# Patient Record
Sex: Female | Born: 1950 | Race: White | Hispanic: No | Marital: Married | Smoking: Never smoker
Health system: Southern US, Community
[De-identification: ages and names within clinical notes are randomized; demographics above are authoritative.]

## PROBLEM LIST (undated history)

## (undated) DIAGNOSIS — S42009A Fracture of unspecified part of unspecified clavicle, initial encounter for closed fracture: Secondary | ICD-10-CM

## (undated) DIAGNOSIS — M8580 Other specified disorders of bone density and structure, unspecified site: Secondary | ICD-10-CM

## (undated) DIAGNOSIS — M810 Age-related osteoporosis without current pathological fracture: Secondary | ICD-10-CM

## (undated) DIAGNOSIS — N952 Postmenopausal atrophic vaginitis: Secondary | ICD-10-CM

## (undated) DIAGNOSIS — C50919 Malignant neoplasm of unspecified site of unspecified female breast: Secondary | ICD-10-CM

## (undated) DIAGNOSIS — Z1371 Encounter for nonprocreative screening for genetic disease carrier status: Secondary | ICD-10-CM

## (undated) HISTORY — PX: TUBAL LIGATION: SHX77

## (undated) HISTORY — DX: Age-related osteoporosis without current pathological fracture: M81.0

## (undated) HISTORY — PX: HERNIA REPAIR: SHX51

## (undated) HISTORY — DX: Fracture of unspecified part of unspecified clavicle, initial encounter for closed fracture: S42.009A

## (undated) HISTORY — PX: TOTAL HIP ARTHROPLASTY: SHX124

## (undated) HISTORY — DX: Postmenopausal atrophic vaginitis: N95.2

## (undated) HISTORY — DX: Other specified disorders of bone density and structure, unspecified site: M85.80

## (undated) HISTORY — DX: Encounter for nonprocreative screening for genetic disease carrier status: Z13.71

## (undated) HISTORY — DX: Malignant neoplasm of unspecified site of unspecified female breast: C50.919

## (undated) HISTORY — PX: BREAST SURGERY: SHX581

---

## 1997-09-01 ENCOUNTER — Other Ambulatory Visit: Admission: RE | Admit: 1997-09-01 | Discharge: 1997-09-01 | Payer: Self-pay | Admitting: Obstetrics and Gynecology

## 1998-09-05 ENCOUNTER — Other Ambulatory Visit: Admission: RE | Admit: 1998-09-05 | Discharge: 1998-09-05 | Payer: Self-pay | Admitting: Obstetrics and Gynecology

## 1999-11-05 ENCOUNTER — Other Ambulatory Visit: Admission: RE | Admit: 1999-11-05 | Discharge: 1999-11-05 | Payer: Self-pay | Admitting: Obstetrics and Gynecology

## 2000-04-03 ENCOUNTER — Other Ambulatory Visit: Admission: RE | Admit: 2000-04-03 | Discharge: 2000-04-03 | Payer: Self-pay | Admitting: Obstetrics and Gynecology

## 2000-04-03 ENCOUNTER — Encounter (INDEPENDENT_AMBULATORY_CARE_PROVIDER_SITE_OTHER): Payer: Self-pay | Admitting: Specialist

## 2000-05-01 ENCOUNTER — Encounter: Payer: Self-pay | Admitting: Obstetrics and Gynecology

## 2000-05-01 ENCOUNTER — Encounter: Admission: RE | Admit: 2000-05-01 | Discharge: 2000-05-01 | Payer: Self-pay | Admitting: Obstetrics and Gynecology

## 2000-11-25 ENCOUNTER — Other Ambulatory Visit: Admission: RE | Admit: 2000-11-25 | Discharge: 2000-11-25 | Payer: Self-pay | Admitting: Obstetrics and Gynecology

## 2001-01-21 ENCOUNTER — Encounter: Payer: Self-pay | Admitting: Obstetrics and Gynecology

## 2001-01-21 ENCOUNTER — Encounter: Admission: RE | Admit: 2001-01-21 | Discharge: 2001-01-21 | Payer: Self-pay | Admitting: Obstetrics and Gynecology

## 2001-04-15 DIAGNOSIS — C50919 Malignant neoplasm of unspecified site of unspecified female breast: Secondary | ICD-10-CM

## 2001-04-15 HISTORY — DX: Malignant neoplasm of unspecified site of unspecified female breast: C50.919

## 2002-02-03 ENCOUNTER — Other Ambulatory Visit: Admission: RE | Admit: 2002-02-03 | Discharge: 2002-02-03 | Payer: Self-pay | Admitting: Obstetrics and Gynecology

## 2002-02-22 ENCOUNTER — Other Ambulatory Visit: Admission: RE | Admit: 2002-02-22 | Discharge: 2002-02-22 | Payer: Self-pay | Admitting: Radiology

## 2002-03-24 ENCOUNTER — Encounter (INDEPENDENT_AMBULATORY_CARE_PROVIDER_SITE_OTHER): Payer: Self-pay | Admitting: *Deleted

## 2002-03-24 ENCOUNTER — Ambulatory Visit (HOSPITAL_BASED_OUTPATIENT_CLINIC_OR_DEPARTMENT_OTHER): Admission: RE | Admit: 2002-03-24 | Discharge: 2002-03-24 | Payer: Self-pay | Admitting: Surgery

## 2002-04-02 ENCOUNTER — Ambulatory Visit: Admission: RE | Admit: 2002-04-02 | Discharge: 2002-04-26 | Payer: Self-pay | Admitting: Radiation Oncology

## 2002-04-12 ENCOUNTER — Encounter (INDEPENDENT_AMBULATORY_CARE_PROVIDER_SITE_OTHER): Payer: Self-pay | Admitting: *Deleted

## 2002-04-12 ENCOUNTER — Ambulatory Visit (HOSPITAL_BASED_OUTPATIENT_CLINIC_OR_DEPARTMENT_OTHER): Admission: RE | Admit: 2002-04-12 | Discharge: 2002-04-12 | Payer: Self-pay | Admitting: Surgery

## 2002-04-12 ENCOUNTER — Encounter: Payer: Self-pay | Admitting: Surgery

## 2002-04-12 ENCOUNTER — Encounter: Admission: RE | Admit: 2002-04-12 | Discharge: 2002-04-12 | Payer: Self-pay | Admitting: Surgery

## 2002-05-03 ENCOUNTER — Encounter: Payer: Self-pay | Admitting: *Deleted

## 2002-05-03 ENCOUNTER — Ambulatory Visit (HOSPITAL_COMMUNITY): Admission: RE | Admit: 2002-05-03 | Discharge: 2002-05-03 | Payer: Self-pay | Admitting: *Deleted

## 2002-05-06 ENCOUNTER — Encounter: Payer: Self-pay | Admitting: Surgery

## 2002-05-06 ENCOUNTER — Ambulatory Visit (HOSPITAL_BASED_OUTPATIENT_CLINIC_OR_DEPARTMENT_OTHER): Admission: RE | Admit: 2002-05-06 | Discharge: 2002-05-06 | Payer: Self-pay | Admitting: Surgery

## 2002-08-18 ENCOUNTER — Ambulatory Visit (HOSPITAL_BASED_OUTPATIENT_CLINIC_OR_DEPARTMENT_OTHER): Admission: RE | Admit: 2002-08-18 | Discharge: 2002-08-18 | Payer: Self-pay | Admitting: Surgery

## 2003-02-07 ENCOUNTER — Other Ambulatory Visit: Admission: RE | Admit: 2003-02-07 | Discharge: 2003-02-07 | Payer: Self-pay | Admitting: Obstetrics and Gynecology

## 2004-02-08 ENCOUNTER — Other Ambulatory Visit: Admission: RE | Admit: 2004-02-08 | Discharge: 2004-02-08 | Payer: Self-pay | Admitting: Obstetrics and Gynecology

## 2004-03-01 ENCOUNTER — Ambulatory Visit: Payer: Self-pay | Admitting: Radiation Oncology

## 2004-04-24 ENCOUNTER — Ambulatory Visit: Payer: Self-pay | Admitting: Oncology

## 2004-08-29 ENCOUNTER — Encounter: Admission: RE | Admit: 2004-08-29 | Discharge: 2004-08-29 | Payer: Self-pay | Admitting: Surgery

## 2004-11-19 ENCOUNTER — Ambulatory Visit: Payer: Self-pay | Admitting: Oncology

## 2005-02-12 ENCOUNTER — Other Ambulatory Visit: Admission: RE | Admit: 2005-02-12 | Discharge: 2005-02-12 | Payer: Self-pay | Admitting: Obstetrics and Gynecology

## 2005-08-21 ENCOUNTER — Ambulatory Visit: Payer: Self-pay | Admitting: Oncology

## 2005-11-11 ENCOUNTER — Encounter: Admission: RE | Admit: 2005-11-11 | Discharge: 2005-11-11 | Payer: Self-pay | Admitting: Oncology

## 2006-02-13 ENCOUNTER — Other Ambulatory Visit: Admission: RE | Admit: 2006-02-13 | Discharge: 2006-02-13 | Payer: Self-pay | Admitting: Obstetrics and Gynecology

## 2006-05-19 ENCOUNTER — Ambulatory Visit: Payer: Self-pay | Admitting: Oncology

## 2007-02-16 ENCOUNTER — Other Ambulatory Visit: Admission: RE | Admit: 2007-02-16 | Discharge: 2007-02-16 | Payer: Self-pay | Admitting: Obstetrics and Gynecology

## 2007-03-19 ENCOUNTER — Ambulatory Visit: Payer: Self-pay | Admitting: Oncology

## 2007-04-16 ENCOUNTER — Ambulatory Visit: Payer: Self-pay | Admitting: Radiation Oncology

## 2007-05-15 ENCOUNTER — Ambulatory Visit: Payer: Self-pay | Admitting: Oncology

## 2007-07-29 ENCOUNTER — Ambulatory Visit: Payer: Self-pay | Admitting: Unknown Physician Specialty

## 2008-02-17 ENCOUNTER — Ambulatory Visit: Payer: Self-pay | Admitting: Obstetrics and Gynecology

## 2008-02-17 ENCOUNTER — Other Ambulatory Visit: Admission: RE | Admit: 2008-02-17 | Discharge: 2008-02-17 | Payer: Self-pay | Admitting: Obstetrics and Gynecology

## 2008-02-17 ENCOUNTER — Encounter: Payer: Self-pay | Admitting: Obstetrics and Gynecology

## 2008-10-03 ENCOUNTER — Ambulatory Visit: Payer: Self-pay | Admitting: Obstetrics and Gynecology

## 2008-10-12 ENCOUNTER — Ambulatory Visit: Payer: Self-pay | Admitting: Obstetrics and Gynecology

## 2009-02-20 ENCOUNTER — Other Ambulatory Visit: Admission: RE | Admit: 2009-02-20 | Discharge: 2009-02-20 | Payer: Self-pay | Admitting: Obstetrics and Gynecology

## 2009-02-20 ENCOUNTER — Ambulatory Visit: Payer: Self-pay | Admitting: Obstetrics and Gynecology

## 2009-02-20 ENCOUNTER — Encounter: Payer: Self-pay | Admitting: Obstetrics and Gynecology

## 2010-02-26 ENCOUNTER — Other Ambulatory Visit: Admission: RE | Admit: 2010-02-26 | Discharge: 2010-02-26 | Payer: Self-pay | Admitting: Obstetrics and Gynecology

## 2010-02-26 ENCOUNTER — Ambulatory Visit: Payer: Self-pay | Admitting: Obstetrics and Gynecology

## 2010-05-07 ENCOUNTER — Encounter: Payer: Self-pay | Admitting: Oncology

## 2010-05-08 ENCOUNTER — Ambulatory Visit
Admission: RE | Admit: 2010-05-08 | Discharge: 2010-05-08 | Payer: Self-pay | Source: Home / Self Care | Attending: Obstetrics and Gynecology | Admitting: Obstetrics and Gynecology

## 2010-08-31 NOTE — Op Note (Signed)
NAME:  Tanya Fox, SCHLOTTER [facility]                          ACCOUNT NO.:  [id number]   MEDICAL RECORD NO.:  [id number]                   PATIENT TYPE:  AMB   LOCATION:  DSC                                  FACILITY:  [facility]   PHYSICIAN:  Currie Paris, M.D.           DATE OF BIRTH:  Sep 06, 1950   DATE OF PROCEDURE:  03/24/2002  DATE OF DISCHARGE:                                 OPERATIVE REPORT   CCS# [id number]   PREOPERATIVE DIAGNOSES:  1. Incarcerated superumbilical hernia.  2. 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   POSTOPERATIVE DIAGNOSES:  1. Incarcerated superumbilical hernia.  2. 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   OPERATION PERFORMED:  1. Repair of incarcerated superumbilical hernia with mesh.  2. Excision of left breast mass.   SURGEON:  Currie Paris, M.D.   ANESTHESIA:  General (LMA)   INDICATIONS FOR PROCEDURE:  The patient is a 60 year old lady with a left  breast mass that looked to be a fibroadenoma but a fine needle aspiration  had shown a few atypical cells and excision had been recommended.  In  addition, she had a small soft mass just above the umbilicus consistent with  a small either superumbilical or umbilical hernia.  After discussion of  alternatives with the patient, she elected to proceed to repair of the  hernia and excision of the left breast mass.   DESCRIPTION OF PROCEDURE:  The patient was seen in the holding area and had  no further questions.  The superumbilical hernia was circled and the left  breast identified as the operative side.  She was taken to the operating  room and prior to the administration of any sedation, the left breast mass  was identified by palpation and confirmed with the patient and marked.  Both  the left breast and umbilical area were prepped and we draped out with  towels and a single large drape at the umbilical site.  I injected a  combination of 1% Xylocaine plus 0.5% Marcaine with epinephrine mixed  equally  and around the umbilical hernia area to help with postoperative  analgesia.  Incision was made and the fatty mass identified in the  subcutaneous tissue.  As I dissected this out from the surrounding  subcutaneous tissue, I found that this was protruding through a small  superumbilical hernia.  Once I had freed up the sac, I was able to reduce  this and there appeared to be omentum protruding out.  The defect was only  about 5 mm and was actually above the umbilicus and not an umbilical hernia  presenting above the umbilicus.  The fascia below the defect appeared to be  intact and the defect itself was only about 5 mm.   I took a small mesh plug and put it in the defect and closed the defect with  two sutures of 2-0 Prolene incorporating the plug.  The incision was then  closed with some 3-0 Vicryl and 4-0 Monocryl subcuticular and at the end of  the case Steri-Strips were applied.   Attention was turned to the left breast mass which was fairly far laterally  at about the 9 o'clock position.  With the breast retracted a little bit  medially, we made an incision over the mass, dissected a little subcutaneous  tissue until I was a little closer to the mass and could palpate it readily.  A holding suture was placed through it for traction and then the mass  excised with what I thought was a small margin of normal tissue around it.  Once this was done, bleeders controlled with the cautery and the incision  closed with some 3-0 Vicryl followed by 4-0 Monocryl subcuticular and Steri-  Strips.   The patient tolerated the procedure well.  There were no operative  complications.  All counts were correct.                                                Currie Paris, M.D.    CJS/MEDQ  D:  03/24/2002  T:  03/24/2002  Job:  [id number]   cc:   Reuel Boom L. Eda Paschal, M.D.  [address]  [place]  [place] [address]  Fax: [phone]   Dale Holcomb  [address]   [place]  [place] [address]  Fax: [phone]   Jeralyn Ruths, M.D.

## 2010-08-31 NOTE — Op Note (Signed)
NAME:  Tanya Fox, SANER [facility]                          ACCOUNT NO.:  [id number]   MEDICAL RECORD NO.:  [id number]                   PATIENT TYPE:  AMB   LOCATION:  DSC                                  FACILITY:  [facility]   PHYSICIAN:  Currie Paris, M.D.           DATE OF BIRTH:  09-Dec-1950   DATE OF PROCEDURE:  05/06/2002  DATE OF DISCHARGE:                                 OPERATIVE REPORT   PREOPERATIVE DIAGNOSIS:  1. Breast cancer.  2. Inadequate venous access for chemotherapy.   POSTOPERATIVE DIAGNOSIS:  1. Breast cancer.  2. Inadequate venous access for chemotherapy.   OPERATION PERFORMED:  Port-A-Cath placement.   SURGEON:  Currie Paris, M.D.   ANESTHESIA:  MAC.   INDICATIONS FOR PROCEDURE:  The patient is a 60 year old getting ready to  have chemotherapy and needs intravenous access.   DESCRIPTION OF PROCEDURE:  The patient was seen in the holding area and the  indications, risks and complications of the surgery were discussed with her.  She has no questions and was prepared to proceed.   The patient was taken to the operating room and after satisfactory IV  sedation, the entire upper chest and lower neck were prepped and draped as a  single sterile field.  She was placed in some Trendelenburg position.  1%  Xylocaine was infiltrated in the right infraclavicular area and the  subclavian vein entered on the initial attempt and the guidewire threaded  easily through the needle.  It was positioned in the superior vena cava and  confirmed with fluoroscopy.   Additional local was infiltrated over the anterior chest wall and transverse  incision made in the pocket fashioned with the cautery.  The tunnel was made  from that site to the guidewire site and the catheter brought through.  The  guidewire tract was dilated with the dilator and peel-away sheath.  This was  done easily and the dilator and the guidewire was removed.  The catheter  threaded easily and was  placed at 20 cm and aspirated and irrigated easily.  Using fluoroscopy I could see this appeared to be in the right atrium, so I  backed it out to 17 cm where it appeared to be in the distal superior vena  cava.  It aspirated and irrigated easily.   The reservoir was flushed with dilute heparin, attached and the locking  mechanism engaged.  It aspirated and irrigated easily.  It was sutured to  the fascia with 2-0 Prolenes.  The incision was closed with some 3-0 Vicryl  and then 4-0 Monocryl.  A final check with fluoroscopy showed good  positioning.  The catheter was aspirated one more  time and then flushed with 10 cc of dilute heparin followed by 5 cc of  concentrated aqueous heparin.  The patient tolerated the procedure well.  There were no operative complications.  All counts were correct.  Currie Paris, M.D.    CJS/MEDQ  D:  05/06/2002  T:  05/06/2002  Job:  [id number]

## 2010-08-31 NOTE — Op Note (Signed)
NAME:  Tanya Fox, Tanya Fox [facility]                          ACCOUNT NO.:  [id number]   MEDICAL RECORD NO.:  [id number]                   PATIENT TYPE:  AMB   LOCATION:  DSC                                  FACILITY:  [facility]   PHYSICIAN:  Currie Paris, M.D.           DATE OF BIRTH:  11-06-1950   DATE OF PROCEDURE:  04/12/2002  DATE OF DISCHARGE:                                 OPERATIVE REPORT   OFFICE MEDICAL RECORD NO:  [id number]   PREOPERATIVE DIAGNOSES:  Carcinoma, left breast lower outer quadrant.   POSTOPERATIVE DIAGNOSES:  Carcinoma, left breast lower outer quadrant.   OPERATION:  Re-excision (partial mastectomy) left breast cancer with blue  dye injection and axillary sentinel lymph node dissection.   SURGEON:  Currie Paris, M.D.   ANESTHESIA:  General.   CLINICAL HISTORY:  This patient is a 60 year old who had a small mass  removed from the right breast, which unfortunately proves to be a small  carcinoma.  After consultation with radiation and chemotherapy physicians,  we elected to do a sentinel node dissection followed by repeat excision;  although our margin was negative.  Previously it was quite close at one  spot.   DESCRIPTION OF PROCEDURE:  The patient was seen in the holding area and had  no further questions.  She was injected previously with her radioactive  nucleotide.  She had marked the left breast as the operative side.   She was taken to the operating room and after satisfactory general  anesthesia had been obtained, I injected 4 cc of Lymphazurin blue  subareolarly.  The breast was then prepped and draped.   Using the needle probe, a hot area was identified in the axilla and somewhat  low down a little bit anterior.  I made an incision directly over that.  I  found two blue lymphatics and traced those up a little bit, and found  initially a very blue lymph node that was excised and had counts to 5500.  There are still some counts in the axilla,  and a little further dissection  revealed a second node that was 50% and had turned blue.  There was blue  lymphatic bleeding into it.  It was excised and had counts of about 2000.  There was still some counts higher up and further dissection revealed a  third node that was not blue but did have counts at around 700.  This was  likewise excised and all three sent as sentinel nodes.  There were no  palpable adenopathy remaining and no high counts remaining with background  around 40-50 or less.   The packing was placed here and attention turned to the lumpectomy site.  I  did an elliptical incision around the old scar and then took about a 2 cm  wide piece of tissue circumferentially around and down to the muscle, using  cautery.  Bleeders had to be  either cauterized or a couple were tied with 3-  0 Vicryl.  Once this had been removed and since we were just getting margins  on a tumor that already had negative margins, I did not feel we needed to do  any touch preps.  The wound was checked for hemostasis.  I used four tiny  clips to mark the periphery and a larger clip to mark the deep and  superficial margins.  The breast tissue was not reapproximated.  I did close  the subcutaneous and then the skin with 4-0 Monocryl subcuticular.  The  axillary incision was closed with 3-0 Vicryl and 4-0 Monocryl.   I have used 0.25% Marcaine prior to making the skin incisions, and  infiltrated more later in the case to help with postoperative analgesia.  Some clips were also used in the axilla on the lymphatics.   The patient tolerated the procedure well.  There were no operative  complications.  All counts were correct.   Dr. Debby Bud from pathology reported that the three sentinel nodes were  negative by touch preps.                                               Currie Paris, M.D.    CJS/MEDQ  D:  04/12/2002  T:  04/12/2002  Job:  [id number]   cc:   Reuel Boom L. Eda Paschal, M.D.  [address], [address]  [place]  [place] [address]  Fax: [phone]   Dale Temple  [address]  [place]  [place] [address]  Fax: [phone]

## 2010-08-31 NOTE — Op Note (Signed)
   NAME:  Tanya Fox, Tanya Fox [facility]                        ACCOUNT NO.:  [id number]   MEDICAL RECORD NO.:  [id number]                   PATIENT TYPE:  AMB   LOCATION:  DSC                                  FACILITY:  [facility]   PHYSICIAN:  Currie Paris, M.D.           DATE OF BIRTH:  01-21-1951   DATE OF PROCEDURE:  08/18/2002  DATE OF DISCHARGE:                                 OPERATIVE REPORT   PREOPERATIVE DIAGNOSIS:  Unneeded Port-A-Cath.   POSTOPERATIVE DIAGNOSIS:  Unneeded Port-A-Cath.   PROCEDURE:  Removal of Port-A-Cath.   SURGEON:  Currie Paris, M.D.   ANESTHESIA:  Local.   CLINICAL HISTORY:  This patient is a 60 year old who has finished her  chemotherapy and getting ready to start radiation.  She wished to have her  port removed.   DESCRIPTION OF PROCEDURE:  The patient was seen in the minor procedure room  and the port site identified.  It was prepped with a little alcohol and  injected with 1% Xylocaine with epinephrine.  I waited about 10 minutes for  that to have good effect and then prepped the area with Betadine.  The old  scar was incised and the Port-A-Cath tubing identified and backed out a  little bit from the tract.  A figure-of-eight of 3-0 Vicryl was placed  around the tract and the Port-A-Cath tubing removed and the Vicryl suture  tied down.  Using a knife I entered the capsule around the Port-A-Cath  reservoir, cut the two holding sutures, and manipulated the reservoir out of  the capsule and pouch that it was in.  There was no significant bleeding.  The incision was closed with some 3-0 Vicryl followed by Dermabond.   The patient tolerated the procedure well, and there were no complications  noted.                                                Currie Paris, M.D.    CJS/MEDQ  D:  08/18/2002  T:  08/19/2002  Job:  [id number]

## 2010-11-29 ENCOUNTER — Encounter: Payer: Self-pay | Admitting: Obstetrics and Gynecology

## 2010-11-30 ENCOUNTER — Encounter: Payer: Self-pay | Admitting: Obstetrics and Gynecology

## 2011-02-26 ENCOUNTER — Encounter: Payer: Self-pay | Admitting: Gynecology

## 2011-02-26 DIAGNOSIS — Z853 Personal history of malignant neoplasm of breast: Secondary | ICD-10-CM | POA: Insufficient documentation

## 2011-02-26 DIAGNOSIS — N952 Postmenopausal atrophic vaginitis: Secondary | ICD-10-CM | POA: Insufficient documentation

## 2011-02-26 DIAGNOSIS — M8580 Other specified disorders of bone density and structure, unspecified site: Secondary | ICD-10-CM | POA: Insufficient documentation

## 2011-03-12 ENCOUNTER — Encounter: Payer: Self-pay | Admitting: Obstetrics and Gynecology

## 2011-03-12 ENCOUNTER — Other Ambulatory Visit (HOSPITAL_COMMUNITY)
Admission: RE | Admit: 2011-03-12 | Discharge: 2011-03-12 | Disposition: A | Discharge status: Home / Self Care | Payer: PRIVATE HEALTH INSURANCE | Source: Ambulatory Visit | Attending: Obstetrics and Gynecology | Admitting: Obstetrics and Gynecology

## 2011-03-12 ENCOUNTER — Ambulatory Visit (INDEPENDENT_AMBULATORY_CARE_PROVIDER_SITE_OTHER): Payer: PRIVATE HEALTH INSURANCE | Admitting: Obstetrics and Gynecology

## 2011-03-12 VITALS — BP 114/70 | Ht 63.0 in | Wt 116.0 lb

## 2011-03-12 DIAGNOSIS — Z01419 Encounter for gynecological examination (general) (routine) without abnormal findings: Secondary | ICD-10-CM | POA: Insufficient documentation

## 2011-03-12 DIAGNOSIS — N952 Postmenopausal atrophic vaginitis: Secondary | ICD-10-CM

## 2011-03-12 DIAGNOSIS — C50919 Malignant neoplasm of unspecified site of unspecified female breast: Secondary | ICD-10-CM

## 2011-03-12 NOTE — Progress Notes (Signed)
Patient came to see me today for her annual GYN exam. She is doing well from her breast cancer. She's upto date on her mammograms. Her medical oncologist did not not want her to use vaginal estrogen for her atrophic vaginitis. She is having no vaginal bleeding. She is having no pelvic pain. She has tried vagisil for the dryness. Her bone density this year showed stable osteopenia without an elevated fracture risk. She does her lab work through her PCP.   Physical examination:  Kennon Portela present HEENT within normal limits. Neck: Thyroid not large. No masses. Supraclavicular nodes: not enlarged. Breasts: Examined in both sitting midline position. No skin changes and no masses. Abdomen: Soft no guarding rebound or masses or hernia. Pelvic: External: Within normal limits. BUS: Within normal limits. Vaginal:within normal limits. Poor estrogen effect. No evidence of cystocele rectocele or enterocele. Cervix: clean. Uterus: Normal size and shape. Adnexa: No masses. Rectovaginal exam: Confirmatory and negative. Extremities: Within normal limits.  Assessment: Breast cancer. Atrophic vaginitis.  Plan: Continue yearly mammograms. BSGI next year. Continue periodic bone densities. Patient to try Replens for dryness.

## 2011-11-27 ENCOUNTER — Other Ambulatory Visit: Payer: Self-pay | Admitting: *Deleted

## 2011-11-27 DIAGNOSIS — Z853 Personal history of malignant neoplasm of breast: Secondary | ICD-10-CM

## 2011-12-06 ENCOUNTER — Other Ambulatory Visit: Payer: Self-pay | Admitting: Obstetrics and Gynecology

## 2011-12-06 DIAGNOSIS — Z853 Personal history of malignant neoplasm of breast: Secondary | ICD-10-CM

## 2012-01-29 ENCOUNTER — Telehealth: Payer: Self-pay | Admitting: *Deleted

## 2012-01-29 NOTE — Telephone Encounter (Signed)
Patient left VM asking if she should have further testing done? New test that a friend of her's had that sees another physician in clinic had done and wants to see if she should have it done? Test name "completion of BRCA gene test". Will leave message for MD to address. Last visit in clinic was 2009-all her info is in prior computer system.

## 2012-03-02 ENCOUNTER — Ambulatory Visit: Payer: PRIVATE HEALTH INSURANCE | Admitting: Internal Medicine

## 2012-03-05 ENCOUNTER — Encounter: Payer: Self-pay | Admitting: Internal Medicine

## 2012-03-05 ENCOUNTER — Ambulatory Visit (INDEPENDENT_AMBULATORY_CARE_PROVIDER_SITE_OTHER): Payer: PRIVATE HEALTH INSURANCE | Admitting: Internal Medicine

## 2012-03-05 VITALS — BP 112/74 | HR 62 | Temp 97.8°F | Ht 64.0 in | Wt 115.0 lb

## 2012-03-05 DIAGNOSIS — R5381 Other malaise: Secondary | ICD-10-CM

## 2012-03-05 DIAGNOSIS — D72819 Decreased white blood cell count, unspecified: Secondary | ICD-10-CM | POA: Insufficient documentation

## 2012-03-05 DIAGNOSIS — C801 Malignant (primary) neoplasm, unspecified: Secondary | ICD-10-CM

## 2012-03-05 DIAGNOSIS — Z139 Encounter for screening, unspecified: Secondary | ICD-10-CM

## 2012-03-05 DIAGNOSIS — M8580 Other specified disorders of bone density and structure, unspecified site: Secondary | ICD-10-CM

## 2012-03-05 DIAGNOSIS — M899 Disorder of bone, unspecified: Secondary | ICD-10-CM

## 2012-03-05 DIAGNOSIS — R197 Diarrhea, unspecified: Secondary | ICD-10-CM

## 2012-03-05 DIAGNOSIS — R5383 Other fatigue: Secondary | ICD-10-CM

## 2012-03-05 NOTE — Patient Instructions (Addendum)
It was nice seeing you today.  I am glad you have been doing well.  I want you to try Align (probiotic) - take one per day and benefiber - daily as we discussed.  Let me know if problems.

## 2012-03-10 ENCOUNTER — Other Ambulatory Visit: Payer: Self-pay | Admitting: Internal Medicine

## 2012-03-10 ENCOUNTER — Other Ambulatory Visit (INDEPENDENT_AMBULATORY_CARE_PROVIDER_SITE_OTHER): Payer: PRIVATE HEALTH INSURANCE

## 2012-03-10 DIAGNOSIS — R5381 Other malaise: Secondary | ICD-10-CM

## 2012-03-10 DIAGNOSIS — Z139 Encounter for screening, unspecified: Secondary | ICD-10-CM

## 2012-03-10 DIAGNOSIS — D72819 Decreased white blood cell count, unspecified: Secondary | ICD-10-CM

## 2012-03-10 DIAGNOSIS — R5383 Other fatigue: Secondary | ICD-10-CM

## 2012-03-10 LAB — COMPREHENSIVE METABOLIC PANEL
AST: 26 U/L (ref 0–37)
CO2: 27 mEq/L (ref 19–32)
Calcium: 9 mg/dL (ref 8.4–10.5)
Chloride: 104 mEq/L (ref 96–112)
Creatinine, Ser: 0.7 mg/dL (ref 0.4–1.2)
GFR: 99.03 mL/min (ref >60.00)
Sodium: 137 mEq/L (ref 135–145)
Total Protein: 6.8 g/dL (ref 6.0–8.3)

## 2012-03-10 LAB — TSH: TSH: 1.68 u[IU]/mL (ref 0.35–5.50)

## 2012-03-10 LAB — LIPID PANEL
Cholesterol: 177 mg/dL (ref 0–200)
LDL Cholesterol: 96 mg/dL (ref 0–99)
VLDL: 8.4 mg/dL (ref 0.0–40.0)

## 2012-03-10 LAB — CBC WITH DIFFERENTIAL/PLATELET
Basophils Absolute: 0 10*3/uL (ref 0.0–0.1)
Lymphs Abs: 1.1 10*3/uL (ref 0.7–4.0)
MCV: 95 fl (ref 78.0–100.0)
Monocytes Absolute: 0.3 10*3/uL (ref 0.1–1.0)
Neutro Abs: 1.4 10*3/uL (ref 1.4–7.7)
RDW: 12.8 % (ref 11.5–14.6)

## 2012-03-11 NOTE — Progress Notes (Signed)
Called and gave lab results to patient. Appointment for labs scheduled.

## 2012-03-16 ENCOUNTER — Encounter: Payer: PRIVATE HEALTH INSURANCE | Admitting: Obstetrics and Gynecology

## 2012-03-16 ENCOUNTER — Ambulatory Visit (INDEPENDENT_AMBULATORY_CARE_PROVIDER_SITE_OTHER): Payer: PRIVATE HEALTH INSURANCE | Admitting: *Deleted

## 2012-03-16 DIAGNOSIS — Z23 Encounter for immunization: Secondary | ICD-10-CM

## 2012-03-18 LAB — TB SKIN TEST: Induration: NORMAL mm

## 2012-03-22 ENCOUNTER — Encounter: Payer: Self-pay | Admitting: Internal Medicine

## 2012-03-22 NOTE — Progress Notes (Signed)
  Subjective:    Patient ID: Tanya Fox, female    DOB: September 22, 1950, 61 y.o.   MRN: [id number]  HPI 61 year old female with past history of breast cancer who comes in today for a schedule follow up and her physical with me.  She states she is doing well.  No chest pain or tightness.  No sob.  Eating and drinking well.  She does report she is having loose stool every day.  Takes immodium daily.  Has been followed by Dr Mady Gemma.  He is retiring 04/14/12.  Gets her mammograms through there - [facility].  Handling stress relatively well.    Past Medical History  Diagnosis Date  . Atrophic vaginitis   . Osteopenia   . Breast cancer     Current Outpatient Prescriptions on File Prior to Visit  Medication Sig Dispense Refill  . Ascorbic Acid (VITAMIN C PO) Take by mouth.        . Calcium Carbonate-Vitamin D (CALCIUM + D PO) Take by mouth.        . Multiple Vitamin (MULTIVITAMIN) tablet Take 1 tablet by mouth daily.        Marland Kitchen VITAMIN E PO Take by mouth.          Review of Systems Patient denies any headache, lightheadedness or dizziness.  No chest pain, tightness or palpitations.  No increased shortness of breath, cough or congestion.  No nausea or vomiting.  No abdominal pain or cramping.  Does report the loose stool as outlined.  Notices every day.  Takes Immodium.  No BRBPR or melana.  No urine change.  Some fatigue, but overall doing well.      Objective:   Physical Exam Filed Vitals:   03/05/12 1332  BP: 112/74  Pulse: 62  Temp: 97.8 F (54.42 C)   61 year old female in no acute distress.   HEENT:  Nares- clear.  Oropharynx - without lesions. NECK:  Supple.  Nontender.  No audible bruit.  HEART:  Appears to be regular. LUNGS:  No crackles or wheezing audible.  Respirations even and unlabored.  RADIAL PULSE:  Equal bilaterally.    BREASTS:  Exam performed through oncology.  ABDOMEN:  Soft, nontender.  Bowel sounds present and normal.  No audible abdominal bruit.  GU:  Performed  through gyn.    EXTREMITIES:  No increased edema present.  DP pulses palpable and equal bilaterally.          Assessment & Plan:  GI.  With the loose stool as outlined.  Start [employer].  Colonoscopy 07/29/07 revealed diverticulosis and internal hemorrhoids.  Exam otherwise normal.  Recommended follow up colonoscopy five years.  Given stool change, will refer back to GI for evaluation and question of need for repeat colonoscopy.    GYN.  Pelvic and paps performed through gyn.  States she is up to date.  Follow.  FATIGUE.  Check cbc, met c and tsh.     HEALTH MAINTENANCE.  Mammograms are done through oncology.  Pelvic and pap smears through GYN.  GI as above.

## 2012-03-22 NOTE — Assessment & Plan Note (Signed)
Followed by oncology.  They are following mammograms.  States she is up to date.  Due a follow up in 12/13.

## 2012-03-22 NOTE — Assessment & Plan Note (Signed)
Continue calcium and vitamin D.  Follow.   

## 2012-03-22 NOTE — Assessment & Plan Note (Signed)
Recheck cbc to confirm stable.  

## 2012-03-23 ENCOUNTER — Ambulatory Visit (INDEPENDENT_AMBULATORY_CARE_PROVIDER_SITE_OTHER): Payer: PRIVATE HEALTH INSURANCE | Admitting: Obstetrics and Gynecology

## 2012-03-23 ENCOUNTER — Encounter: Payer: Self-pay | Admitting: Obstetrics and Gynecology

## 2012-03-23 VITALS — BP 110/74 | Ht 63.0 in | Wt 112.0 lb

## 2012-03-23 DIAGNOSIS — Z01419 Encounter for gynecological examination (general) (routine) without abnormal findings: Secondary | ICD-10-CM

## 2012-03-23 NOTE — Patient Instructions (Signed)
Check with cancer Center-genetic testing as to whether other genetic testing should be done. Get  me most recent bone density.

## 2012-03-23 NOTE — Progress Notes (Signed)
Patient came to see me today for her annual GYN exam. She is a breast cancer survivor. She was diagnosed in 2003. She was treated with lumpectomy, radiation and chemotherapy. She no longer sees the oncologist. She has yearly mammograms and has been this year. She was tested for BRCA1 and 2 and was negative. She has never had an abnormal Pap smear. Her last Pap smear was 2012. She says she has bone densities every other year but I have not received 1 since 2008. She had mild osteopenia then with her worst T score -1.2. She will get me her more recent ones. She does her lab through her PCP. She is being evaluated for recurrent leukopenia. She is having no vaginal bleeding. She is having no pelvic pain. She is doing well in terms of menopausal symptoms. A friend  told her she should have other genetic testing besides BRCA1 and 2. Her family history is negative except for early onset breast cancer.  Physical examination:Tanya Fox present. HEENT within normal limits. Neck: Thyroid not large. No masses. Supraclavicular nodes: not enlarged. Breasts: Examined in both sitting and lying  position. No skin changes and no masses. Abdomen: Soft no guarding rebound or masses or hernia. Pelvic: External: Within normal limits. BUS: Within normal limits. Vaginal:within normal limits. Good estrogen effect. No evidence of cystocele rectocele or enterocele. Cervix: clean. Uterus: Normal size and shape. Adnexa: No masses. Rectovaginal exam: Confirmatory and negative. Extremities: Within normal limits.  Assessment: #1. 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 #2. BRCA1 and 2 negative. #3. Leukopenia #4. Osteopenia  Plan: Continue yearly mammograms. Patient to give me more recent bone densities. Pap done.The new Pap smear guidelines were discussed with the patient. I told patient I did not think she needed other genetic testing but asked  Her To check with the cancer center-genetic counseling. Leukopenia followup  with PCP.

## 2012-03-24 LAB — URINALYSIS W MICROSCOPIC + REFLEX CULTURE
Bacteria, UA: NONE SEEN
Bilirubin Urine: NEGATIVE
Casts: NONE SEEN
Crystals: NONE SEEN
Glucose, UA: NEGATIVE mg/dL
Hgb urine dipstick: NEGATIVE
Ketones, ur: NEGATIVE mg/dL
Leukocytes, UA: NEGATIVE
Nitrite: NEGATIVE
Protein, ur: NEGATIVE mg/dL
Specific Gravity, Urine: 1.011 (ref 1.005–1.030)
Squamous Epithelial / HPF: NONE SEEN
Urobilinogen, UA: 0.2 mg/dL (ref 0.0–1.0)
pH: 6.5 (ref 5.0–8.0)

## 2012-03-27 ENCOUNTER — Telehealth: Payer: Self-pay | Admitting: Obstetrics and Gynecology

## 2012-03-27 ENCOUNTER — Other Ambulatory Visit: Payer: Self-pay | Admitting: Obstetrics and Gynecology

## 2012-03-27 MED ORDER — METRONIDAZOLE 0.75 % VA GEL: 1.0000 | Freq: Two times a day (BID) | VAGINAL | Status: DC

## 2012-03-27 NOTE — Telephone Encounter (Signed)
Patient called because she picked up her Metrogel today and it says applicatorful bid.  Pt knew I had told her hs x 5 days.  I did see that it had come up that way in Epic when I put in Metrogel but I did not notice the incorrect directions.  I apologized to patient for the confusion and did confirm with her that it is appful hs x 5 nights.

## 2012-04-02 ENCOUNTER — Other Ambulatory Visit (INDEPENDENT_AMBULATORY_CARE_PROVIDER_SITE_OTHER): Payer: PRIVATE HEALTH INSURANCE

## 2012-04-02 ENCOUNTER — Encounter: Payer: Self-pay | Admitting: Obstetrics and Gynecology

## 2012-04-02 DIAGNOSIS — D72819 Decreased white blood cell count, unspecified: Secondary | ICD-10-CM

## 2012-04-02 LAB — CBC WITH DIFFERENTIAL/PLATELET
Basophils Absolute: 0 10*3/uL (ref 0.0–0.1)
HCT: 40.4 % (ref 36.0–46.0)
Lymphs Abs: 1.2 10*3/uL (ref 0.7–4.0)
Monocytes Relative: 10.9 % (ref 3.0–12.0)
Neutro Abs: 1.1 10*3/uL — ABNORMAL LOW (ref 1.4–7.7)
RDW: 12.4 % (ref 11.5–14.6)

## 2012-04-05 ENCOUNTER — Telehealth: Payer: Self-pay | Admitting: Internal Medicine

## 2012-04-05 DIAGNOSIS — D72819 Decreased white blood cell count, unspecified: Secondary | ICD-10-CM

## 2012-04-05 NOTE — Telephone Encounter (Signed)
Pt notified via my chart messaging regarding labs and need for follow up labs in one month.  i will order labs.  Please call and schedule her a nonfasting lab appt in one month.  Thanks.

## 2012-04-14 ENCOUNTER — Other Ambulatory Visit (INDEPENDENT_AMBULATORY_CARE_PROVIDER_SITE_OTHER): Payer: PRIVATE HEALTH INSURANCE

## 2012-04-14 ENCOUNTER — Telehealth: Payer: Self-pay | Admitting: Internal Medicine

## 2012-04-14 DIAGNOSIS — D72819 Decreased white blood cell count, unspecified: Secondary | ICD-10-CM

## 2012-04-14 LAB — CBC WITH DIFFERENTIAL/PLATELET
Basophils Relative: 0.9 % (ref 0.0–3.0)
Eosinophils Absolute: 0.2 10*3/uL (ref 0.0–0.7)
Hemoglobin: 13.1 g/dL (ref 12.0–15.0)
Lymphocytes Relative: 38 % (ref 12.0–46.0)
MCHC: 33.2 g/dL (ref 30.0–36.0)
MCV: 93.7 fl (ref 78.0–100.0)
Monocytes Absolute: 0.4 10*3/uL (ref 0.1–1.0)
Neutro Abs: 1.8 10*3/uL (ref 1.4–7.7)
Neutrophils Relative %: 44.8 % (ref 43.0–77.0)
Platelets: 207 10*3/uL (ref 150.0–400.0)
RBC: 4.2 Mil/uL (ref 3.87–5.11)

## 2012-04-14 NOTE — Telephone Encounter (Signed)
Pt notified of labs and need for follow up lab in 4-5 months.  I will place order for lab.  Please call and schedule her for a nonfasting lab in 4-5 months.  Thanks.

## 2012-04-17 NOTE — Telephone Encounter (Signed)
Lab appointmetn 08/24/12 pt aware of appointment

## 2012-04-28 ENCOUNTER — Ambulatory Visit: Payer: PRIVATE HEALTH INSURANCE | Admitting: Gynecology

## 2012-05-14 ENCOUNTER — Ambulatory Visit (INDEPENDENT_AMBULATORY_CARE_PROVIDER_SITE_OTHER): Payer: PRIVATE HEALTH INSURANCE | Admitting: Gynecology

## 2012-05-14 ENCOUNTER — Encounter: Payer: Self-pay | Admitting: Gynecology

## 2012-05-14 ENCOUNTER — Other Ambulatory Visit (HOSPITAL_COMMUNITY)
Admission: RE | Admit: 2012-05-14 | Discharge: 2012-05-14 | Disposition: A | Discharge status: Home / Self Care | Payer: PRIVATE HEALTH INSURANCE | Source: Ambulatory Visit | Attending: Gynecology | Admitting: Gynecology

## 2012-05-14 DIAGNOSIS — Z01419 Encounter for gynecological examination (general) (routine) without abnormal findings: Secondary | ICD-10-CM | POA: Insufficient documentation

## 2012-05-14 DIAGNOSIS — N760 Acute vaginitis: Secondary | ICD-10-CM

## 2012-05-14 DIAGNOSIS — Z1151 Encounter for screening for human papillomavirus (HPV): Secondary | ICD-10-CM | POA: Insufficient documentation

## 2012-05-14 DIAGNOSIS — R87615 Unsatisfactory cytologic smear of cervix: Secondary | ICD-10-CM

## 2012-05-14 NOTE — Patient Instructions (Signed)
Follow up in one year for annual exam 

## 2012-05-14 NOTE — Progress Notes (Signed)
Patient presents for repeat Pap smear. Recently saw Dr. Eda Paschal for her annual exam her Pap smear returned unsatisfactory due to inflammation. She was treated with MetroGel and follows up now for Pap smear.  Exam with Selena Batten assistant External BUS vagina with atrophic changes. Cervix flush with the upper vagina cervical os somewhat stenotic but admits the brush. Uterus normal size midline mobile nontender. Adnexa without masses or tenderness.  Assessment and plan: Vaginitis causing unsatisfactory Pap smear, treated repeat Pap smear now. Assuming acceptable and follow up in one year for annual exam, sooner as needed.

## 2012-05-14 NOTE — Addendum Note (Signed)
Addended by: Dayna Barker on: 05/14/2012 02:35 PM   Modules accepted: Orders

## 2012-05-30 ENCOUNTER — Other Ambulatory Visit: Payer: Self-pay

## 2012-07-30 ENCOUNTER — Ambulatory Visit: Payer: Self-pay | Admitting: Unknown Physician Specialty

## 2012-07-31 LAB — PATHOLOGY REPORT

## 2012-08-24 ENCOUNTER — Other Ambulatory Visit (INDEPENDENT_AMBULATORY_CARE_PROVIDER_SITE_OTHER): Payer: PRIVATE HEALTH INSURANCE

## 2012-08-24 DIAGNOSIS — D72819 Decreased white blood cell count, unspecified: Secondary | ICD-10-CM

## 2012-08-24 LAB — CBC WITH DIFFERENTIAL/PLATELET
Eosinophils Relative: 2.1 % (ref 0.0–5.0)
Hemoglobin: 12.8 g/dL (ref 12.0–15.0)
MCHC: 34.5 g/dL (ref 30.0–36.0)
Monocytes Absolute: 0.4 10*3/uL (ref 0.1–1.0)
Neutrophils Relative %: 56.4 % (ref 43.0–77.0)
Platelets: 195 10*3/uL (ref 150.0–400.0)

## 2012-08-25 ENCOUNTER — Encounter: Payer: Self-pay | Admitting: Internal Medicine

## 2012-11-04 ENCOUNTER — Telehealth: Payer: Self-pay | Admitting: *Deleted

## 2012-11-04 NOTE — Telephone Encounter (Signed)
Pt has appt with [facility] on 12/03/12 for bone density asking if order will be ready. I left message on her voicemail that [facility] will fax [place] order MD will sign and we will fax back

## 2012-11-10 ENCOUNTER — Telehealth: Payer: Self-pay | Admitting: *Deleted

## 2012-11-10 NOTE — Telephone Encounter (Signed)
MD response to question from 01/2012--offer referral to genetics counselor to discuss availability and indication for new testing. Left VM for patient to return call if she is still interested in his response to her question.

## 2012-11-17 ENCOUNTER — Telehealth: Payer: Self-pay | Admitting: *Deleted

## 2012-11-17 NOTE — Telephone Encounter (Signed)
Pt returned call. Left her a voicemail with Dr. Kalman Drape response. We can make a referral to genetics counselor to discuss the indication for further testing. Requested she let us know if she is interested.

## 2012-11-24 ENCOUNTER — Other Ambulatory Visit: Payer: Self-pay | Admitting: *Deleted

## 2012-11-24 DIAGNOSIS — M8580 Other specified disorders of bone density and structure, unspecified site: Secondary | ICD-10-CM

## 2012-12-03 ENCOUNTER — Encounter: Payer: Self-pay | Admitting: Gynecology

## 2012-12-08 ENCOUNTER — Other Ambulatory Visit: Payer: Self-pay | Admitting: *Deleted

## 2012-12-08 DIAGNOSIS — M8580 Other specified disorders of bone density and structure, unspecified site: Secondary | ICD-10-CM

## 2012-12-15 ENCOUNTER — Telehealth: Payer: Self-pay | Admitting: Internal Medicine

## 2012-12-15 ENCOUNTER — Encounter: Payer: Self-pay | Admitting: *Deleted

## 2012-12-15 NOTE — Telephone Encounter (Signed)
Sent pt a mychart message. 

## 2012-12-15 NOTE — Telephone Encounter (Signed)
The patient is wanting a TB skin test . She also wants to know has it been a year since her last test.

## 2012-12-16 ENCOUNTER — Encounter: Payer: Self-pay | Admitting: Gynecology

## 2013-02-18 ENCOUNTER — Other Ambulatory Visit: Payer: Self-pay

## 2013-03-08 ENCOUNTER — Ambulatory Visit (INDEPENDENT_AMBULATORY_CARE_PROVIDER_SITE_OTHER): Payer: Commercial Indemnity | Admitting: Internal Medicine

## 2013-03-08 ENCOUNTER — Encounter: Payer: Self-pay | Admitting: Internal Medicine

## 2013-03-08 ENCOUNTER — Telehealth: Payer: Self-pay | Admitting: *Deleted

## 2013-03-08 VITALS — BP 120/70 | HR 64 | Temp 98.0°F | Ht 63.25 in | Wt 117.2 lb

## 2013-03-08 DIAGNOSIS — M8580 Other specified disorders of bone density and structure, unspecified site: Secondary | ICD-10-CM

## 2013-03-08 DIAGNOSIS — C801 Malignant (primary) neoplasm, unspecified: Secondary | ICD-10-CM

## 2013-03-08 DIAGNOSIS — R197 Diarrhea, unspecified: Secondary | ICD-10-CM

## 2013-03-08 DIAGNOSIS — D72819 Decreased white blood cell count, unspecified: Secondary | ICD-10-CM

## 2013-03-08 DIAGNOSIS — M899 Disorder of bone, unspecified: Secondary | ICD-10-CM

## 2013-03-08 NOTE — Telephone Encounter (Signed)
I recommend that we talk about it when she comes in for her annual exam.

## 2013-03-08 NOTE — Telephone Encounter (Signed)
Pt has annual scheduled on 04/04/13 pt c/o vaginal dryness worsen has used Replens and a vaginal suppository with no relief. Pt is breast cancer survivor, is no longer seeing medical oncologist. Pt asked if you have any recommendations? Please advise

## 2013-03-08 NOTE — Progress Notes (Signed)
  Subjective:    Patient ID: Tanya Fox, female    DOB: 27-Jan-1951, 62 y.o.   MRN: [id number]  HPI 62 year old female with past history of breast cancer who comes in today for a schedule follow up and her physical with me.  She states she is doing well.  No chest pain or tightness.  No sob.  Eating and drinking well.  She does report she is having loose stool every day.  Takes multiple immodium daily. Saw GI.  Her a colonoscopy.  States she was instructed to take an Immodium every morning.  Has no abdominal pain or cramping.  No nausea or vomiting.  Has tried a probiotic and fiber.  Did not help.   Has been followed by Dr Mady Gemma for her breast cancer.  He is retired 04/14/12.  Gets her mammograms through [facility].  Now seeing Dr Bryna Colander.  Handling stress relatively well.    Past Medical History  Diagnosis Date  . Atrophic vaginitis   . Osteopenia 11/2012    T score -1.2 FRAX 6.6%/0.5%  . Breast cancer     Current Outpatient Prescriptions on File Prior to Visit  Medication Sig Dispense Refill  . Ascorbic Acid (VITAMIN C PO) Take by mouth.        . Calcium Carbonate-Vitamin D (CALCIUM + D PO) Take by mouth.        . Multiple Vitamin (MULTIVITAMIN) tablet Take 1 tablet by mouth daily.        Marland Kitchen VITAMIN E PO Take by mouth.         No current facility-administered medications on file prior to visit.    Review of Systems Patient denies any headache, lightheadedness or dizziness.  No sinus or allergy symptoms.  No chest pain, tightness or palpitations.  No increased shortness of breath, cough or congestion.  No nausea or vomiting.  No abdominal pain or cramping.  Does report the loose stool as outlined.  Notices every day.  Takes multiple Immodium daily.   No BRBPR or melana.  No urine change.  Some fatigue, but overall doing well.  Needs a TB skin test.       Objective:   Physical Exam  Filed Vitals:   03/08/13 1333  BP: 120/70  Pulse: 64  Temp: 98 F (64.20 C)   62 year old  female in no acute distress.   HEENT:  Nares- clear.  Oropharynx - without lesions. NECK:  Supple.  Nontender.  No audible bruit.  HEART:  Appears to be regular. LUNGS:  No crackles or wheezing audible.  Respirations even and unlabored.  RADIAL PULSE:  Equal bilaterally.    BREASTS:  Exam performed through oncology.  ABDOMEN:  Soft, nontender.  Bowel sounds present and normal.  No audible abdominal bruit.  GU:  Performed through gyn.    EXTREMITIES:  No increased edema present.  DP pulses palpable and equal bilaterally.          Assessment & Plan:  GYN.  Pelvic and paps performed through gyn.  States she is up to date.  Follow.  Due to follow up with Dr Audie Box end of December.    FATIGUE.  Check cbc, met c and tsh.     HEALTH MAINTENANCE.  Mammograms are done through oncology.  Pelvic and pap smears through GYN.  GI as outlined.

## 2013-03-08 NOTE — Telephone Encounter (Signed)
error 

## 2013-03-08 NOTE — Progress Notes (Signed)
Pre-visit discussion using our clinic review tool. No additional management support is needed unless otherwise documented below in the visit note.  

## 2013-03-08 NOTE — Telephone Encounter (Signed)
Pt is c/o vaginal dryness that has worsened. Left message for pt to call.

## 2013-03-08 NOTE — Telephone Encounter (Signed)
Left the below on pt voicemail. 

## 2013-03-09 ENCOUNTER — Telehealth: Payer: Self-pay | Admitting: *Deleted

## 2013-03-09 ENCOUNTER — Ambulatory Visit (INDEPENDENT_AMBULATORY_CARE_PROVIDER_SITE_OTHER): Payer: Commercial Indemnity | Admitting: *Deleted

## 2013-03-09 ENCOUNTER — Other Ambulatory Visit (INDEPENDENT_AMBULATORY_CARE_PROVIDER_SITE_OTHER): Payer: Commercial Indemnity

## 2013-03-09 DIAGNOSIS — C801 Malignant (primary) neoplasm, unspecified: Secondary | ICD-10-CM

## 2013-03-09 DIAGNOSIS — D72819 Decreased white blood cell count, unspecified: Secondary | ICD-10-CM

## 2013-03-09 DIAGNOSIS — M8580 Other specified disorders of bone density and structure, unspecified site: Secondary | ICD-10-CM

## 2013-03-09 DIAGNOSIS — Z13220 Encounter for screening for lipoid disorders: Secondary | ICD-10-CM

## 2013-03-09 DIAGNOSIS — Z111 Encounter for screening for respiratory tuberculosis: Secondary | ICD-10-CM

## 2013-03-09 LAB — COMPREHENSIVE METABOLIC PANEL
ALT: 17 U/L (ref 0–35)
AST: 23 U/L (ref 0–37)
Albumin: 4.1 g/dL (ref 3.5–5.2)
Alkaline Phosphatase: 77 U/L (ref 39–117)
BUN: 14 mg/dL (ref 6–23)
CO2: 28 mEq/L (ref 19–32)
Calcium: 9.4 mg/dL (ref 8.4–10.5)
Chloride: 103 mEq/L (ref 96–112)
Creatinine, Ser: 0.6 mg/dL (ref 0.4–1.2)
GFR: 108.25 mL/min (ref >60.00)
Glucose, Bld: 82 mg/dL (ref 70–99)
Potassium: 4 mEq/L (ref 3.5–5.1)
Sodium: 139 mEq/L (ref 135–145)
Total Protein: 6.5 g/dL (ref 6.0–8.3)

## 2013-03-09 LAB — CBC WITH DIFFERENTIAL/PLATELET
Basophils Absolute: 0 10*3/uL (ref 0.0–0.1)
Basophils Relative: 0.6 % (ref 0.0–3.0)
Eosinophils Absolute: 0.2 10*3/uL (ref 0.0–0.7)
HCT: 39.3 % (ref 36.0–46.0)
Lymphocytes Relative: 42.5 % (ref 12.0–46.0)
MCHC: 33.8 g/dL (ref 30.0–36.0)
MCV: 93.9 fl (ref 78.0–100.0)
Monocytes Absolute: 0.3 10*3/uL (ref 0.1–1.0)
Neutro Abs: 1.4 10*3/uL (ref 1.4–7.7)
Neutrophils Relative %: 42.9 % — ABNORMAL LOW (ref 43.0–77.0)
Platelets: 175 10*3/uL (ref 150.0–400.0)
RBC: 4.19 Mil/uL (ref 3.87–5.11)
RDW: 13.4 % (ref 11.5–14.6)
WBC: 3.3 10*3/uL — ABNORMAL LOW (ref 4.5–10.5)

## 2013-03-09 LAB — LIPID PANEL
Cholesterol: 192 mg/dL (ref 0–200)
LDL Cholesterol: 109 mg/dL — ABNORMAL HIGH (ref 0–99)
Total CHOL/HDL Ratio: 3
Triglycerides: 50 mg/dL (ref 0.0–149.0)
VLDL: 10 mg/dL (ref 0.0–40.0)

## 2013-03-09 NOTE — Telephone Encounter (Signed)
Lab orders placed.  

## 2013-03-09 NOTE — Telephone Encounter (Signed)
What labs and dx?  

## 2013-03-10 ENCOUNTER — Encounter: Payer: Self-pay | Admitting: Internal Medicine

## 2013-03-10 ENCOUNTER — Telehealth: Payer: Self-pay | Admitting: Internal Medicine

## 2013-03-10 DIAGNOSIS — D72819 Decreased white blood cell count, unspecified: Secondary | ICD-10-CM

## 2013-03-10 NOTE — Telephone Encounter (Signed)
Pt notified of lab results via my chart.  Needs a f/u non fasting lab appt in 4-6 weeks.  Please schedule and contact her with an appt date and time.  Thanks.

## 2013-03-11 ENCOUNTER — Encounter: Payer: Self-pay | Admitting: Internal Medicine

## 2013-03-11 DIAGNOSIS — R197 Diarrhea, unspecified: Secondary | ICD-10-CM | POA: Insufficient documentation

## 2013-03-11 NOTE — Assessment & Plan Note (Signed)
Followed by oncology.  They are following mammograms.  States she is up to date.  Due a follow up in 04/14/13.

## 2013-03-11 NOTE — Assessment & Plan Note (Signed)
Recheck cbc to confirm stable.  

## 2013-03-11 NOTE — Assessment & Plan Note (Signed)
Continue vitamin D.  Follow.    

## 2013-03-11 NOTE — Assessment & Plan Note (Signed)
Persistent.  Takes multiple immodium daily.  Recently saw GI.  Had a recent colonoscopy.  Will discuss with GI regarding further treatment.

## 2013-03-12 LAB — TB SKIN TEST: TB Skin Test: NEGATIVE

## 2013-03-15 NOTE — Telephone Encounter (Signed)
scheduled

## 2013-03-15 NOTE — Telephone Encounter (Signed)
Left message for pt to call office

## 2013-03-24 ENCOUNTER — Encounter: Payer: PRIVATE HEALTH INSURANCE | Admitting: Gynecology

## 2013-04-14 ENCOUNTER — Encounter: Payer: Self-pay | Admitting: Gynecology

## 2013-04-14 ENCOUNTER — Ambulatory Visit (INDEPENDENT_AMBULATORY_CARE_PROVIDER_SITE_OTHER): Payer: Managed Care, Other (non HMO) | Admitting: Gynecology

## 2013-04-14 VITALS — BP 116/74 | Ht 63.5 in | Wt 115.0 lb

## 2013-04-14 DIAGNOSIS — N952 Postmenopausal atrophic vaginitis: Secondary | ICD-10-CM

## 2013-04-14 DIAGNOSIS — Z01419 Encounter for gynecological examination (general) (routine) without abnormal findings: Secondary | ICD-10-CM

## 2013-04-14 NOTE — Progress Notes (Signed)
Tanya Fox 15-May-1950 [id number]        62 y.o.  [id number] for annual exam.  Former patient of Dr. Eda Paschal. Several issues noted below.  Past medical history,surgical history, problem list, medications, allergies, family history and social history were all reviewed and documented in the EPIC chart.  ROS:  Performed and pertinent positives and negatives are included in the history, assessment and plan .  Exam: Kim assistant Filed Vitals:   04/14/13 1426  BP: 116/74  Height: 5' 3.5" (1.613 m)  Weight: 115 lb (52.164 kg)   General appearance  Normal Skin grossly normal Head/Neck normal with no cervical or supraclavicular adenopathy thyroid normal Lungs  clear Cardiac RR, without RMG Abdominal  soft, nontender, without masses, organomegaly or hernia Breasts  examined lying and sitting without masses, retractions, discharge or axillary adenopathy. Pelvic  Ext/BUS/vagina  Normal with atrophic changes  Cervix  Normal with atrophic changes, flush with the upper vagina.  Uterus  anteverted, normal size, shape and contour, midline and mobile nontender   Adnexa  Without masses or tenderness    Anus and perineum  Normal   Rectovaginal  Normal sphincter tone without palpated masses or tenderness.    Assessment/Plan:  62 y.o. [id number] female for annual exam.   1. Postmenopausal/atrophic vaginal changes. Without significant hot flashes or night sweats. Is having significant vaginal dryness with dyspareunia. Has tried OTC products without good success. Reviewed alternative options to include vaginal estrogen such as Vagifem or estrogen cream. Issues of absorption with stimulation of breast cancer reviewed. Also the issues of thrombosis such as stroke heart attack DVT. Osphena also discussed with limited breast data in breast cancer survivors. Patient is 10 years out from her treatment of receptor positive carcinoma. Recommended she followup with her oncologist for their opinion although I think  it certainly is reasonable to consider Vagifem from a quality of life standpoint I would like their input and approval. Patient's going to make an appointment to discuss with them and continue with OTC products at this point. No vaginal bleeding or other menopausal symptoms. Patient knows to call if any vaginal bleeding. 2. 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 Status post lumpectomy, radiation and chemotherapy. Doing well with exam NED. Mammogram 11/2012. Continue annual mammography. SBE monthly reviewed. 3. Osteopenia. DEXA 11/2012 T score -1.2. FRAX 6.6%/0.5%. Increase calcium vitamin D reviewed. Repeat DEXA at two-year interval. 4. Pap smear 04/2012. The Pap smear done today. No history of significant abnormal Pap smears previously. Plan repeat Pap smear at 3 year interval. 5. Colonoscopy 2014. Repeat that they're recommended interval. 6. Health maintenance. No routine blood work done as this is all done through her primary physician's office. Followup one year, sooner as needed.   Note: This document was prepared with digital dictation and possible smart phrase technology. Any transcriptional errors that result from this process are unintentional.   Dara Lords MD, 3:00 PM 04/14/2013

## 2013-04-14 NOTE — Patient Instructions (Signed)
follow up in one year for annual exam, sooner as needed. 

## 2013-04-15 LAB — URINALYSIS W MICROSCOPIC + REFLEX CULTURE
Bacteria, UA: NONE SEEN
Bilirubin Urine: NEGATIVE
Casts: NONE SEEN
Crystals: NONE SEEN
Glucose, UA: NEGATIVE mg/dL
Hgb urine dipstick: NEGATIVE
Ketones, ur: NEGATIVE mg/dL
Leukocytes, UA: NEGATIVE
Nitrite: NEGATIVE
Protein, ur: NEGATIVE mg/dL
Specific Gravity, Urine: 1.008 (ref 1.005–1.030)
Squamous Epithelial / LPF: NONE SEEN
Urobilinogen, UA: 0.2 mg/dL (ref 0.0–1.0)
pH: 7 (ref 5.0–8.0)

## 2013-04-23 ENCOUNTER — Telehealth: Payer: Self-pay | Admitting: *Deleted

## 2013-04-23 NOTE — Telephone Encounter (Signed)
Pt called stating she will need a referral to hematology/medical oncology per note on 04/14/13. I spoke with Tiffany at [facility] she is going to contact patient to have this scheduled. Pt was informed with this as well.

## 2013-04-27 ENCOUNTER — Encounter: Payer: Self-pay | Admitting: Internal Medicine

## 2013-04-27 ENCOUNTER — Other Ambulatory Visit (INDEPENDENT_AMBULATORY_CARE_PROVIDER_SITE_OTHER): Payer: Commercial Indemnity

## 2013-04-27 DIAGNOSIS — D72819 Decreased white blood cell count, unspecified: Secondary | ICD-10-CM

## 2013-04-27 LAB — CBC WITH DIFFERENTIAL/PLATELET
BASOS ABS: 0 10*3/uL (ref 0.0–0.1)
Basophils Relative: 0.5 % (ref 0.0–3.0)
EOS ABS: 0.2 10*3/uL (ref 0.0–0.7)
Eosinophils Relative: 3.9 % (ref 0.0–5.0)
HCT: 38.8 % (ref 36.0–46.0)
Hemoglobin: 13.1 g/dL (ref 12.0–15.0)
Lymphocytes Relative: 40 % (ref 12.0–46.0)
Lymphs Abs: 1.6 10*3/uL (ref 0.7–4.0)
MCHC: 33.7 g/dL (ref 30.0–36.0)
MCV: 93.8 fl (ref 78.0–100.0)
MONO ABS: 0.3 10*3/uL (ref 0.1–1.0)
Monocytes Relative: 8.3 % (ref 3.0–12.0)
NEUTROS PCT: 47.3 % (ref 43.0–77.0)
Neutro Abs: 1.9 10*3/uL (ref 1.4–7.7)
PLATELETS: 194 10*3/uL (ref 150.0–400.0)
RBC: 4.13 Mil/uL (ref 3.87–5.11)
RDW: 13.1 % (ref 11.5–14.6)
WBC: 4 10*3/uL — ABNORMAL LOW (ref 4.5–10.5)

## 2013-05-20 ENCOUNTER — Telehealth: Payer: Self-pay | Admitting: *Deleted

## 2013-05-20 NOTE — Telephone Encounter (Signed)
Per note on 04/14/13 referral faxed to [facility], they will contact patient to schedule.

## 2013-05-27 ENCOUNTER — Telehealth: Payer: Self-pay | Admitting: *Deleted

## 2013-05-27 NOTE — Telephone Encounter (Signed)
Dr. Benay Spice does not feel it is necessary for patient to come in for appointment just to discuss the vaginal dryness issues in regards to her H/O breast cancer. He will call Dr. Phineas Real and discuss with him. If patient also needs him to call her, he will. Left message on her cell # to call office and ask for Dr. Gearldine Shown nurse.

## 2013-05-28 NOTE — Telephone Encounter (Signed)
Per 05/27/13 note from [place] office   " Dr. Benay Spice does not feel it is necessary for patient to come in for appointment just to discuss the vaginal dryness issues in regards to her H/O breast cancer. He will call Dr. Phineas Real and discuss with him. If patient also needs him to call her, he will.  Left message on her cell # to call office and ask for Dr. Gearldine Shown nurse.

## 2013-06-07 ENCOUNTER — Telehealth: Payer: Self-pay | Admitting: *Deleted

## 2013-06-07 NOTE — Telephone Encounter (Signed)
See Dr.Sherill nurse note on 05/27/13 pt was informed with this as well and is calling to follow up. Please advise

## 2013-06-08 NOTE — Telephone Encounter (Signed)
I sent a note to Dr. Learta Codding and when he responds I will let patient know and if she wants to start Vagifem and he is okay with this then we can.

## 2013-06-08 NOTE — Telephone Encounter (Signed)
Pt informed with the below note. 

## 2013-06-18 MED ORDER — ESTRADIOL 10 MCG VA TABS: 1.0000 | ORAL_TABLET | VAGINAL | Status: DC

## 2013-06-18 NOTE — Telephone Encounter (Signed)
Dr. Benay Spice sent in-basket correspondence to Dr. Phineas Real.

## 2013-06-18 NOTE — Telephone Encounter (Signed)
I just received a response from Dr. Benay Spice and he stated:  "There is no good data for or against the use of estrogen in this setting. I think there is a small potential risk of promoting breast cancer, either a new cancer or stimulating recurrent disease. If she is willing to accept this risk it is ok to use the vaginal estrogen".  If she is okay then Vagifem 10 mcg twice weekly through next annual exam

## 2013-06-18 NOTE — Telephone Encounter (Signed)
Pt informed, rx sent 

## 2013-08-16 ENCOUNTER — Other Ambulatory Visit: Payer: Self-pay

## 2013-08-16 MED ORDER — ESTRADIOL 10 MCG VA TABS: 1.0000 | ORAL_TABLET | VAGINAL | Status: DC

## 2013-08-20 ENCOUNTER — Other Ambulatory Visit: Payer: Self-pay

## 2013-08-20 MED ORDER — ESTRADIOL 10 MCG VA TABS: 1.0000 | ORAL_TABLET | VAGINAL | Status: DC

## 2013-08-23 ENCOUNTER — Other Ambulatory Visit: Payer: Self-pay

## 2013-08-23 MED ORDER — ESTRADIOL 10 MCG VA TABS: 1.0000 | ORAL_TABLET | VAGINAL | Status: DC

## 2013-12-10 ENCOUNTER — Encounter: Payer: Self-pay | Admitting: Gynecology

## 2014-02-14 ENCOUNTER — Encounter: Payer: Self-pay | Admitting: Gynecology

## 2014-02-28 ENCOUNTER — Other Ambulatory Visit: Payer: Self-pay | Admitting: Gynecology

## 2014-02-28 ENCOUNTER — Telehealth: Payer: Self-pay | Admitting: Internal Medicine

## 2014-02-28 NOTE — Telephone Encounter (Signed)
The patient is wanting an appointment with Dr. Nicki Reaper . She stated her sciatic nerve on her left side is bothering her and she wants to see Dr. Nicki Reaper.

## 2014-02-28 NOTE — Telephone Encounter (Signed)
I can see her at 12:00 tomorrow.  Work in for this problem,

## 2014-02-28 NOTE — Telephone Encounter (Signed)
Pt notified and verbalized understanding.

## 2014-02-28 NOTE — Telephone Encounter (Signed)
Spoke with pt, she states the pain is on her right side not her left.  She states the pain is a 10/10.  Further states the pain starts at her right hip and radiates down her leg, it wakes her up at night as well. Please advise

## 2014-03-01 ENCOUNTER — Ambulatory Visit (INDEPENDENT_AMBULATORY_CARE_PROVIDER_SITE_OTHER): Payer: Commercial Indemnity | Admitting: Internal Medicine

## 2014-03-01 ENCOUNTER — Encounter: Payer: Self-pay | Admitting: Internal Medicine

## 2014-03-01 ENCOUNTER — Ambulatory Visit: Payer: Self-pay | Admitting: Internal Medicine

## 2014-03-01 VITALS — BP 123/75 | HR 72 | Temp 97.7°F | Ht 63.5 in | Wt 116.2 lb

## 2014-03-01 DIAGNOSIS — M545 Low back pain, unspecified: Secondary | ICD-10-CM

## 2014-03-01 MED ORDER — MELOXICAM 15 MG PO TABS: 15.0000 mg | ORAL_TABLET | Freq: Every day | ORAL | Status: DC

## 2014-03-01 NOTE — Progress Notes (Signed)
Pre visit review using our clinic review tool, if applicable. No additional management support is needed unless otherwise documented below in the visit note. 

## 2014-03-02 ENCOUNTER — Telehealth: Payer: Self-pay | Admitting: Internal Medicine

## 2014-03-02 NOTE — Telephone Encounter (Signed)
Pt notified of L-S spine xray results via my chart.  Refer to physical therapy.

## 2014-03-06 ENCOUNTER — Encounter: Payer: Self-pay | Admitting: Internal Medicine

## 2014-03-06 DIAGNOSIS — M549 Dorsalgia, unspecified: Secondary | ICD-10-CM | POA: Insufficient documentation

## 2014-03-06 NOTE — Progress Notes (Signed)
  Subjective:    Patient ID: Tanya Fox, female    DOB: 1951/01/01, 63 y.o.   MRN: [id number]  Hip Pain   63 year old female with past history of breast cancer who comes in today as a work in with concerns regarding right lower back pain.  Has been present for approximately two months.  for the last month has been more constant.  Really worsened over the last few days.  Moving is better.  If she stands straight - aggravates.  Has been using heat and ice.  Has been taking advil with some relief.   She states she is doing well otherwise.  Staying active.     Past Medical History  Diagnosis Date  . Atrophic vaginitis   . Osteopenia 11/2012    T score -1.2 FRAX 6.6%/0.5%  . BRCA negative   . Breast cancer 2003    Left side, Stage I    Current Outpatient Prescriptions on File Prior to Visit  Medication Sig Dispense Refill  . Ascorbic Acid (VITAMIN C PO) Take by mouth.      . Calcium Carbonate-Vitamin D (CALCIUM + D PO) Take by mouth.      . Multiple Vitamin (MULTIVITAMIN) tablet Take 1 tablet by mouth daily.      Marland Kitchen VAGIFEM 10 MCG TABS vaginal tablet INSERT 1 TABLET VAGINALLY TWO TIMES A WEEK 24 tablet 0  . VITAMIN E PO Take by mouth.       No current facility-administered medications on file prior to visit.    Review of Systems Low back pain as outlined.   No nausea or vomiting.  No abdominal pain or cramping.  No pain or numbness/tingling going down the leg.         Objective:   Physical Exam  Filed Vitals:   03/01/14 1211  BP: 123/75  Pulse: 72  Temp: 97.7 F (48.70 C)   63 year old female in no acute distress. NECK:  Supple.  Nontender.  No audible bruit.  HEART:  Appears to be regular. LUNGS:  No crackles or wheezing audible.  Respirations even and unlabored. ABDOMEN:  Soft, nontender.  Bowel sounds present and normal.  No audible abdominal bruit.    EXTREMITIES:  No increased edema present.   MSK:  No pain to palpation over the spine.  No significant pain with  straight leg raise.  Motor strength appears to be equal bilateral lower extremities.            Assessment & Plan:  Right-sided low back pain without sciatica Persistent pain as outlined.  Will check L-S spine xray.  Meloxicam as directed.  Tylenol if needed. Stop advil.  Further w/up pending results.    HEALTH MAINTENANCE.  Mammograms are done through oncology.  Pelvic and pap smears through GYN.  GI as outlined.

## 2014-03-14 ENCOUNTER — Ambulatory Visit (INDEPENDENT_AMBULATORY_CARE_PROVIDER_SITE_OTHER): Payer: Commercial Indemnity | Admitting: Internal Medicine

## 2014-03-14 ENCOUNTER — Encounter: Payer: Self-pay | Admitting: Internal Medicine

## 2014-03-14 VITALS — BP 120/80 | HR 75 | Temp 98.3°F | Ht 63.5 in | Wt 113.8 lb

## 2014-03-14 DIAGNOSIS — D72819 Decreased white blood cell count, unspecified: Secondary | ICD-10-CM

## 2014-03-14 DIAGNOSIS — M545 Low back pain, unspecified: Secondary | ICD-10-CM

## 2014-03-14 DIAGNOSIS — M8580 Other specified disorders of bone density and structure, unspecified site: Secondary | ICD-10-CM

## 2014-03-14 DIAGNOSIS — Z111 Encounter for screening for respiratory tuberculosis: Secondary | ICD-10-CM

## 2014-03-14 DIAGNOSIS — C801 Malignant (primary) neoplasm, unspecified: Secondary | ICD-10-CM

## 2014-03-14 NOTE — Progress Notes (Signed)
Pre visit review using our clinic review tool, if applicable. No additional management support is needed unless otherwise documented below in the visit note. 

## 2014-03-14 NOTE — Progress Notes (Signed)
Subjective:    Patient ID: Tanya Fox, female    DOB: 09-Nov-1950, 63 y.o.   MRN: [id number]  HPI 63 year old female with past history of breast cancer who comes in today for a schedule follow up and her physical with me.  She states she is doing well.  No chest pain or tightness.  No sob.  Eating and drinking well.  Has no abdominal pain or cramping.  No nausea or vomiting.  Has been followed by Dr Isaiah Blakes for her breast cancer.  He is retired 04/14/12.  Gets her mammograms through [place].  Last 12/07/13 - Birads II.  Now seeing Dr Acie Fredrickson.  Has f/u planned in 04/2014.  Handling stress well.  Back pain has resolved.  Feels good.     Past Medical History  Diagnosis Date  . Atrophic vaginitis   . Osteopenia 11/2012    T score -1.2 FRAX 6.6%/0.5%  . BRCA negative   . Breast cancer 2003    Left side, Stage I    Current Outpatient Prescriptions on File Prior to Visit  Medication Sig Dispense Refill  . Ascorbic Acid (VITAMIN C PO) Take by mouth.      . Calcium Carbonate-Vitamin D (CALCIUM + D PO) Take by mouth.      . meloxicam (MOBIC) 15 MG tablet Take 1 tablet (15 mg total) by mouth daily. 20 tablet 0  . Multiple Vitamin (MULTIVITAMIN) tablet Take 1 tablet by mouth daily.      Marland Kitchen VAGIFEM 10 MCG TABS vaginal tablet INSERT 1 TABLET VAGINALLY TWO TIMES A WEEK 24 tablet 0  . VITAMIN E PO Take by mouth.       No current facility-administered medications on file prior to visit.    Review of Systems Patient denies any headache, lightheadedness or dizziness.  No sinus or allergy symptoms.  No chest pain, tightness or palpitations.  No increased shortness of breath, cough or congestion.  No nausea or vomiting.  No abdominal pain or cramping.  Bowels stable.   No BRBPR or melana.  No urine change.  Wants a TB skin test.  Back pain is better.       Objective:   Physical Exam  Filed Vitals:   03/14/14 1330  BP: 120/80  Pulse: 75  Temp: 98.3 F (36.8 C)   Blood pressure recheck  37/48  63 year old female in no acute distress.   HEENT:  Nares- clear.  Oropharynx - without lesions. NECK:  Supple.  Nontender.  No audible bruit.  HEART:  Appears to be regular. LUNGS:  No crackles or wheezing audible.  Respirations even and unlabored.  RADIAL PULSE:  Equal bilaterally.    BREASTS:  Exam performed through oncology.  ABDOMEN:  Soft, nontender.  Bowel sounds present and normal.  No audible abdominal bruit.  GU:  Performed through gyn.    EXTREMITIES:  No increased edema present.  DP pulses palpable and equal bilaterally.          Assessment & Plan:  PPD screening test - PPD placed.   Osteopenia Continue vitamin D supplementation.  Check vitamin D level.    Cancer Up to date with mammogram.  Last 12/07/13 -birads II.  Seeing Dr Phineas Real.    Leukopenia White count has been stable.  Recheck cbc.    Right-sided low back pain without sciatica Resoved.    GYN.  Pelvic and paps performed through gyn.  States she is up to date.  Follow.  Due  to follow up with Dr Phineas Real in 04/2014.   HEALTH MAINTENANCE.  Mammograms are done through oncology.  Pelvic and pap smears through GYN.  Colonoscopy 07/30/12 - internal hemorrhoids (otherwise normal).

## 2014-03-17 LAB — TB SKIN TEST
Induration: 0 mm
TB SKIN TEST: NEGATIVE

## 2014-03-18 ENCOUNTER — Encounter: Payer: Self-pay | Admitting: Internal Medicine

## 2014-03-18 ENCOUNTER — Other Ambulatory Visit (INDEPENDENT_AMBULATORY_CARE_PROVIDER_SITE_OTHER): Payer: Commercial Indemnity

## 2014-03-18 ENCOUNTER — Telehealth: Payer: Self-pay | Admitting: *Deleted

## 2014-03-18 DIAGNOSIS — M8580 Other specified disorders of bone density and structure, unspecified site: Secondary | ICD-10-CM

## 2014-03-18 DIAGNOSIS — Z13220 Encounter for screening for lipoid disorders: Secondary | ICD-10-CM

## 2014-03-18 DIAGNOSIS — C801 Malignant (primary) neoplasm, unspecified: Secondary | ICD-10-CM

## 2014-03-18 DIAGNOSIS — D72819 Decreased white blood cell count, unspecified: Secondary | ICD-10-CM

## 2014-03-18 NOTE — Telephone Encounter (Signed)
Orders placed for labs

## 2014-03-18 NOTE — Telephone Encounter (Signed)
What labs and dx?  

## 2014-03-20 ENCOUNTER — Encounter: Payer: Self-pay | Admitting: Internal Medicine

## 2014-03-20 LAB — CBC WITH DIFFERENTIAL/PLATELET
BASOS ABS: 0 10*3/uL (ref 0.0–0.1)
Basophils Relative: 0.3 % (ref 0.0–3.0)
Eosinophils Absolute: 0.2 10*3/uL (ref 0.0–0.7)
Eosinophils Relative: 4.8 % (ref 0.0–5.0)
HEMATOCRIT: 40.2 % (ref 36.0–46.0)
Hemoglobin: 13.2 g/dL (ref 12.0–15.0)
Lymphocytes Relative: 36.8 % (ref 12.0–46.0)
Lymphs Abs: 1.3 10*3/uL (ref 0.7–4.0)
MCHC: 32.8 g/dL (ref 30.0–36.0)
MCV: 94.8 fl (ref 78.0–100.0)
MONOS PCT: 13.4 % — AB (ref 3.0–12.0)
Monocytes Absolute: 0.5 10*3/uL (ref 0.1–1.0)
Neutro Abs: 1.6 10*3/uL (ref 1.4–7.7)
Neutrophils Relative %: 44.7 % (ref 43.0–77.0)
Platelets: 201 10*3/uL (ref 150.0–400.0)
RBC: 4.24 Mil/uL (ref 3.87–5.11)
RDW: 13.1 % (ref 11.5–15.5)
WBC: 3.5 10*3/uL — ABNORMAL LOW (ref 4.0–10.5)

## 2014-03-20 LAB — COMPREHENSIVE METABOLIC PANEL
ALT: 18 U/L (ref 0–35)
AST: 26 U/L (ref 0–37)
Albumin: 4.4 g/dL (ref 3.5–5.2)
Alkaline Phosphatase: 73 U/L (ref 39–117)
BUN: 15 mg/dL (ref 6–23)
CO2: 26 meq/L (ref 19–32)
CREATININE: 0.8 mg/dL (ref 0.4–1.2)
Calcium: 9.3 mg/dL (ref 8.4–10.5)
Chloride: 106 mEq/L (ref 96–112)
GFR: 83.39 mL/min (ref >60.00)
GLUCOSE: 71 mg/dL (ref 70–99)
Potassium: 4.2 mEq/L (ref 3.5–5.1)
Sodium: 141 mEq/L (ref 135–145)
Total Bilirubin: 0.4 mg/dL (ref 0.2–1.2)
Total Protein: 6.8 g/dL (ref 6.0–8.3)

## 2014-03-20 LAB — LIPID PANEL
CHOLESTEROL: 175 mg/dL (ref 0–200)
HDL: 72.4 mg/dL (ref >39.00)
LDL Cholesterol: 91 mg/dL (ref 0–99)
NonHDL: 102.6
TRIGLYCERIDES: 57 mg/dL (ref 0.0–149.0)
Total CHOL/HDL Ratio: 2
VLDL: 11.4 mg/dL (ref 0.0–40.0)

## 2014-03-21 LAB — VITAMIN D 25 HYDROXY (VIT D DEFICIENCY, FRACTURES): VITD: 34.12 ng/mL (ref 30.00–100.00)

## 2014-03-21 LAB — TSH: TSH: 1.61 u[IU]/mL (ref 0.35–4.50)

## 2014-03-22 ENCOUNTER — Telehealth: Payer: Self-pay | Admitting: Internal Medicine

## 2014-03-22 ENCOUNTER — Encounter: Payer: Self-pay | Admitting: Internal Medicine

## 2014-03-22 DIAGNOSIS — D72819 Decreased white blood cell count, unspecified: Secondary | ICD-10-CM

## 2014-03-22 NOTE — Telephone Encounter (Signed)
Pt notified of lab results via my chart.  Needs a non fasting lab appt in 6 months.  Please schedule and contact her with an appt date and time.  Thanks.

## 2014-04-05 NOTE — Telephone Encounter (Signed)
Unread mychart message mailed to patient 

## 2014-04-20 ENCOUNTER — Encounter: Payer: Managed Care, Other (non HMO) | Admitting: Gynecology

## 2014-05-31 ENCOUNTER — Encounter: Payer: Managed Care, Other (non HMO) | Admitting: Gynecology

## 2014-06-17 ENCOUNTER — Telehealth: Payer: Self-pay | Admitting: Internal Medicine

## 2014-06-17 NOTE — Telephone Encounter (Signed)
Please call pt and confirm no increased abdominal pain, etc.  If any acute symptoms - needs evaluation.

## 2014-06-17 NOTE — Telephone Encounter (Signed)
fyi

## 2014-06-17 NOTE — Telephone Encounter (Signed)
[facility] Patient Name: Tanya Fox DOB: October 30, 1950 Initial Comment Caller states she had a flu shot in Oct and has been having flu like symptoms, Caller states she has diarrhea and feeling sick to her stomach. Caller states she does not have a fever. Nurse Assessment Guidelines Guideline Title Affirmed Question Affirmed Notes Final Disposition User Comments CALLER DID NOT WANT TO BE TRIAGED - SHE JUST WANTED TO KNOW IF SHE HAD THE FLU OF SOME KIND OF A STOMACH BUG. CALLER STATES THAT SHE STARTED DIARRHEA AND VOMITING FOR 48 HOURS. TODAY SHE IS HAVING DIARRHEA STILL. ABD PAIN HAS RESTARTED. SHE HAS TAKEN SOME TUMS. SHE FEELS LIKE SHE WANT TO VOMIT AGAIN. SHE IS NOT HAVING ANY FEVER. SHE STATES SHE WILL STOP TAKING SO MUCH OF THE IMMODIUM AND SHE WILL GO TO URGENT CARE IF SHE FEELS LIKE SHE NEEDS TO. WILL CLOSE THIS CLINICAL CALL.

## 2014-06-17 NOTE — Telephone Encounter (Signed)
Left message on VM for pt to return call.

## 2014-07-06 ENCOUNTER — Encounter: Payer: Managed Care, Other (non HMO) | Admitting: Gynecology

## 2014-07-20 ENCOUNTER — Encounter: Payer: Self-pay | Admitting: Gynecology

## 2014-07-20 ENCOUNTER — Ambulatory Visit (INDEPENDENT_AMBULATORY_CARE_PROVIDER_SITE_OTHER): Payer: Managed Care, Other (non HMO) | Admitting: Gynecology

## 2014-07-20 VITALS — BP 120/76 | Ht 63.0 in | Wt 112.0 lb

## 2014-07-20 DIAGNOSIS — N952 Postmenopausal atrophic vaginitis: Secondary | ICD-10-CM

## 2014-07-20 DIAGNOSIS — C50912 Malignant neoplasm of unspecified site of left female breast: Secondary | ICD-10-CM | POA: Diagnosis not present

## 2014-07-20 DIAGNOSIS — Z01419 Encounter for gynecological examination (general) (routine) without abnormal findings: Secondary | ICD-10-CM | POA: Diagnosis not present

## 2014-07-20 MED ORDER — ESTRADIOL 10 MCG VA TABS: ORAL_TABLET | VAGINAL | Status: DC

## 2014-07-20 NOTE — Progress Notes (Signed)
Tanya Fox [birth year] [id number]        64 y.o.  G2P2002 for annual exam.  Doing well without complaints.  Past medical history,surgical history, problem list, medications, allergies, family history and social history were all reviewed and documented as reviewed in the EPIC chart.  ROS:  Performed with pertinent positives and negatives included in the history, assessment and plan.   Additional significant findings :  none   Exam: Tanya Fox Vitals:   07/20/14 1417  BP: 120/76  Height: 5\' 3"  (1.6 m)  Weight: 112 lb (50.803 kg)   General appearance:  Normal affect, orientation and appearance. Skin: Grossly normal HEENT: Without gross lesions.  No cervical or supraclavicular adenopathy. Thyroid normal.  Lungs:  Clear without wheezing, rales or rhonchi Cardiac: RR, without RMG Abdominal:  Soft, nontender, without masses, guarding, rebound, organomegaly or hernia Breasts:  Examined lying and sitting without masses, retractions, discharge or axillary adenopathy. Pelvic:  Ext/BUS/vagina with atrophic changes  Cervix with atrophic changes  Uterus axial, normal size, shape and contour, midline and mobile nontender   Adnexa  Without masses or tenderness    Anus and perineum  Normal   Rectovaginal  Normal sphincter tone without palpated masses or tenderness.    Assessment/Plan:  64 y.o. G15P2002 female for annual exam.   1. Postmenopausal/atrophic genital changes. Started on Vagifem last year with great results. Patient wants to continue. I again reviewed the whole issue of possible absorption and systemic effects such as increased risk of thrombosis and breast cancer. She is stage I receptor positive left-sided breast cancer 2003. We discussed the use of Vagifem with Dr. Ammie Dalton and the patient's comfortable continuing this recognizing the potential for stimulating metastatic or new breast cancer. Refill 1 year provided. 2. Left-sided stage I breast cancer 2003. Exam NED.   Mammography 11/2013. Continue with annual mammography. SBE monthly reviewed. 3. Osteopenia. DEXA 2014 T score -1.2. FRAX 6.6%/0.5%. Plan on repeat DEXA in another year or 2. Increased calcium vitamin D reviewed. 4. Pap smear/HPV 2014 negative. No past Mehr done today. Plan repeat at 3-5 year interval. No history of abnormal Pap smears previously. 5. Colonoscopy 2014. Repeat at their recommended interval. 6. Health maintenance. No routine blood work done as this is done at her primary physician's office. Follow up 1 year, sooner as needed.     Tanya Auerbach MD, 2:47 PM 07/20/2014

## 2014-07-20 NOTE — Patient Instructions (Signed)
You may obtain a copy of any labs that were done today by logging onto MyChart as outlined in the instructions provided with your AVS (after visit summary). The office will not call with normal lab results but certainly if there are any significant abnormalities then we will contact you.   Health Maintenance, Female A healthy lifestyle and preventative care can promote health and wellness.  Maintain regular health, dental, and eye exams.  Eat a healthy diet. Foods like vegetables, fruits, whole grains, low-fat dairy products, and lean protein foods contain the nutrients you need without too many calories. Decrease your intake of foods high in solid fats, added sugars, and salt. Get information about a proper diet from your caregiver, if necessary.  Regular physical exercise is one of the most important things you can do for your health. Most adults should get at least 150 minutes of moderate-intensity exercise (any activity that increases your heart rate and causes you to sweat) each week. In addition, most adults need muscle-strengthening exercises on 2 or more days a week.   Maintain a healthy weight. The body mass index (BMI) is a screening tool to identify possible weight problems. It provides an estimate of body fat based on height and weight. Your caregiver can help determine your BMI, and can help you achieve or maintain a healthy weight. For adults 20 years and older:  A BMI below 18.5 is considered underweight.  A BMI of 18.5 to 24.9 is normal.  A BMI of 25 to 29.9 is considered overweight.  A BMI of 30 and above is considered obese.  Maintain normal blood lipids and cholesterol by exercising and minimizing your intake of saturated fat. Eat a balanced diet with plenty of fruits and vegetables. Blood tests for lipids and cholesterol should begin at age 61 and be repeated every 5 years. If your lipid or cholesterol levels are high, you are over 50, or you are a high risk for heart  disease, you may need your cholesterol levels checked more frequently.Ongoing high lipid and cholesterol levels should be treated with medicines if diet and exercise are not effective.  If you smoke, find out from your caregiver how to quit. If you do not use tobacco, do not start.  Lung cancer screening is recommended for adults aged 33 80 years who are at high risk for developing lung cancer because of a history of smoking. Yearly low-dose computed tomography (CT) is recommended for people who have at least a 30-pack-year history of smoking and are a current smoker or have quit within the past 15 years. A pack year of smoking is smoking an average of 1 pack of cigarettes a day for 1 year (for example: 1 pack a day for 30 years or 2 packs a day for 15 years). Yearly screening should continue until the smoker has stopped smoking for at least 15 years. Yearly screening should also be stopped for people who develop a health problem that would prevent them from having lung cancer treatment.  If you are pregnant, do not drink alcohol. If you are breastfeeding, be very cautious about drinking alcohol. If you are not pregnant and choose to drink alcohol, do not exceed 1 drink per day. One drink is considered to be 12 ounces (355 mL) of beer, 5 ounces (148 mL) of wine, or 1.5 ounces (44 mL) of liquor.  Avoid use of street drugs. Do not share needles with anyone. Ask for help if you need support or instructions about stopping  the use of drugs.  High blood pressure causes heart disease and increases the risk of stroke. Blood pressure should be checked at least every 1 to 2 years. Ongoing high blood pressure should be treated with medicines, if weight loss and exercise are not effective.  If you are 59 to 62 years old, ask your caregiver if you should take aspirin to prevent strokes.  Diabetes screening involves taking a blood sample to check your fasting blood sugar level. This should be done once every 3  years, after age 91, if you are within normal weight and without risk factors for diabetes. Testing should be considered at a younger age or be carried out more frequently if you are overweight and have at least 1 risk factor for diabetes.  Breast cancer screening is essential preventative care for women. You should practice "breast self-awareness." This means understanding the normal appearance and feel of your breasts and may include breast self-examination. Any changes detected, no matter how small, should be reported to a caregiver. Women in their 66s and 30s should have a clinical breast exam (CBE) by a caregiver as part of a regular health exam every 1 to 3 years. After age 101, women should have a CBE every year. Starting at age 100, women should consider having a mammogram (breast X-ray) every year. Women who have a family history of breast cancer should talk to their caregiver about genetic screening. Women at a high risk of breast cancer should talk to their caregiver about having an MRI and a mammogram every year.  Breast cancer gene (BRCA)-related cancer risk assessment is recommended for women who have family members with BRCA-related cancers. BRCA-related cancers include breast, ovarian, tubal, and peritoneal cancers. Having family members with these cancers may be associated with an increased risk for harmful changes (mutations) in the breast cancer genes BRCA1 and BRCA2. Results of the assessment will determine the need for genetic counseling and BRCA1 and BRCA2 testing.  The Pap test is a screening test for cervical cancer. Women should have a Pap test starting at age 57. Between ages 25 and 35, Pap tests should be repeated every 2 years. Beginning at age 37, you should have a Pap test every 3 years as long as the past 3 Pap tests have been normal. If you had a hysterectomy for a problem that was not cancer or a condition that could lead to cancer, then you no longer need Pap tests. If you are  between ages 50 and 76, and you have had normal Pap tests going back 10 years, you no longer need Pap tests. If you have had past treatment for cervical cancer or a condition that could lead to cancer, you need Pap tests and screening for cancer for at least 20 years after your treatment. If Pap tests have been discontinued, risk factors (such as a new sexual partner) need to be reassessed to determine if screening should be resumed. Some women have medical problems that increase the chance of getting cervical cancer. In these cases, your caregiver may recommend more frequent screening and Pap tests.  The human papillomavirus (HPV) test is an additional test that may be used for cervical cancer screening. The HPV test looks for the virus that can cause the cell changes on the cervix. The cells collected during the Pap test can be tested for HPV. The HPV test could be used to screen women aged 44 years and older, and should be used in women of any age  who have unclear Pap test results. After the age of 55, women should have HPV testing at the same frequency as a Pap test.  Colorectal cancer can be detected and often prevented. Most routine colorectal cancer screening begins at the age of 44 and continues through age 20. However, your caregiver may recommend screening at an earlier age if you have risk factors for colon cancer. On a yearly basis, your caregiver may provide home test kits to check for hidden blood in the stool. Use of a small camera at the end of a tube, to directly examine the colon (sigmoidoscopy or colonoscopy), can detect the earliest forms of colorectal cancer. Talk to your caregiver about this at age 86, when routine screening begins. Direct examination of the colon should be repeated every 5 to 10 years through age 13, unless early forms of pre-cancerous polyps or small growths are found.  Hepatitis C blood testing is recommended for all people born from [birth year] through 1963 and any  individual with known risks for hepatitis C.  Practice safe sex. Use condoms and avoid high-risk sexual practices to reduce the spread of sexually transmitted infections (STIs). Sexually active women aged 36 and younger should be checked for Chlamydia, which is a common sexually transmitted infection. Older women with new or multiple partners should also be tested for Chlamydia. Testing for other STIs is recommended if you are sexually active and at increased risk.  Osteoporosis is a disease in which the bones lose minerals and strength with aging. This can result in serious bone fractures. The risk of osteoporosis can be identified using a bone density scan. Women ages 20 and over and women at risk for fractures or osteoporosis should discuss screening with their caregivers. Ask your caregiver whether you should be taking a calcium supplement or vitamin D to reduce the rate of osteoporosis.  Menopause can be associated with physical symptoms and risks. Hormone replacement therapy is available to decrease symptoms and risks. You should talk to your caregiver about whether hormone replacement therapy is right for you.  Use sunscreen. Apply sunscreen liberally and repeatedly throughout the day. You should seek shade when your shadow is shorter than you. Protect yourself by wearing long sleeves, pants, a wide-brimmed hat, and sunglasses year round, whenever you are outdoors.  Notify your caregiver of new moles or changes in moles, especially if there is a change in shape or color. Also notify your caregiver if a mole is larger than the size of a pencil eraser.  Stay current with your immunizations. Document Released: 10/15/2010 Document Revised: 07/27/2012 Document Reviewed: 10/15/2010 [facility] Patient Information 2014 [employer].

## 2014-07-21 LAB — URINALYSIS W MICROSCOPIC + REFLEX CULTURE
BILIRUBIN URINE: NEGATIVE
Bacteria, UA: NONE SEEN
CASTS: NONE SEEN
CRYSTALS: NONE SEEN
GLUCOSE, UA: NEGATIVE mg/dL
Hgb urine dipstick: NEGATIVE
KETONES UR: NEGATIVE mg/dL
Leukocytes, UA: NEGATIVE
Nitrite: NEGATIVE
Protein, ur: NEGATIVE mg/dL
SPECIFIC GRAVITY, URINE: 1.02 (ref 1.005–1.030)
SQUAMOUS EPITHELIAL / LPF: NONE SEEN
Urobilinogen, UA: 0.2 mg/dL (ref 0.0–1.0)
pH: 5.5 (ref 5.0–8.0)

## 2014-08-03 ENCOUNTER — Encounter: Payer: Self-pay | Admitting: Internal Medicine

## 2014-09-21 ENCOUNTER — Other Ambulatory Visit: Payer: Commercial Indemnity

## 2014-09-21 ENCOUNTER — Other Ambulatory Visit (INDEPENDENT_AMBULATORY_CARE_PROVIDER_SITE_OTHER): Payer: Commercial Indemnity

## 2014-09-21 ENCOUNTER — Encounter: Payer: Self-pay | Admitting: Internal Medicine

## 2014-09-21 DIAGNOSIS — D72819 Decreased white blood cell count, unspecified: Secondary | ICD-10-CM | POA: Diagnosis not present

## 2014-09-21 LAB — CBC WITH DIFFERENTIAL/PLATELET
BASOS ABS: 0 10*3/uL (ref 0.0–0.1)
Basophils Relative: 0.4 % (ref 0.0–3.0)
EOS ABS: 0.1 10*3/uL (ref 0.0–0.7)
Eosinophils Relative: 3.1 % (ref 0.0–5.0)
HEMATOCRIT: 39.4 % (ref 36.0–46.0)
HEMOGLOBIN: 13.4 g/dL (ref 12.0–15.0)
LYMPHS ABS: 1.7 10*3/uL (ref 0.7–4.0)
Lymphocytes Relative: 40.1 % (ref 12.0–46.0)
MCHC: 34.1 g/dL (ref 30.0–36.0)
MCV: 93.3 fl (ref 78.0–100.0)
Monocytes Absolute: 0.3 10*3/uL (ref 0.1–1.0)
Monocytes Relative: 7.5 % (ref 3.0–12.0)
NEUTROS ABS: 2.1 10*3/uL (ref 1.4–7.7)
Neutrophils Relative %: 48.9 % (ref 43.0–77.0)
Platelets: 192 10*3/uL (ref 150.0–400.0)
RBC: 4.23 Mil/uL (ref 3.87–5.11)
RDW: 13.4 % (ref 11.5–15.5)
WBC: 4.3 10*3/uL (ref 4.0–10.5)

## 2014-10-03 ENCOUNTER — Telehealth: Payer: Self-pay | Admitting: *Deleted

## 2014-10-03 NOTE — Telephone Encounter (Signed)
Okay for Septra DS 1 by mouth twice a day 3 days #6 and Diflucan 150 mg #2, one by mouth as needed for yeast

## 2014-10-03 NOTE — Telephone Encounter (Signed)
Pt will be on a cruise for 1 week on July 2nd asked if you would be willing to give her Rx for UTI and yeast to have on hand if needed? Please advise

## 2014-10-04 MED ORDER — SULFAMETHOXAZOLE-TRIMETHOPRIM 800-160 MG PO TABS: 1.0000 | ORAL_TABLET | Freq: Two times a day (BID) | ORAL | Status: DC

## 2014-10-04 MED ORDER — FLUCONAZOLE 150 MG PO TABS: ORAL_TABLET | ORAL | Status: DC

## 2014-10-04 NOTE — Telephone Encounter (Signed)
Left on pt voicemail rx has been sent

## 2014-11-22 ENCOUNTER — Telehealth: Payer: Self-pay | Admitting: *Deleted

## 2014-11-22 MED ORDER — MELOXICAM 7.5 MG PO TABS: ORAL_TABLET | ORAL | Status: DC

## 2014-11-22 NOTE — Telephone Encounter (Signed)
Pt called requesting Meloxicam refill.  It does not appear you have prescribed this before.  It looks like something was done on 11.17.15.  Last OV was 11.30.15.  Please advise

## 2014-11-22 NOTE — Telephone Encounter (Signed)
Your pt

## 2014-11-22 NOTE — Telephone Encounter (Signed)
Please confirm with pt why she needs the medication.  If any acute issues - let me know if I need to see her.

## 2014-11-22 NOTE — Telephone Encounter (Signed)
Pt states she is going to begin keeping her 61 month old grandson and she is wanting to prevent a possible flare due to the lifting and carrying of him.

## 2014-11-22 NOTE — Telephone Encounter (Signed)
If has taken and tolerated, ok to call in meloxicam 7.5mg  q day prn #20 with no refills.

## 2014-12-15 ENCOUNTER — Encounter: Payer: Self-pay | Admitting: Gynecology

## 2014-12-20 ENCOUNTER — Encounter: Payer: Self-pay | Admitting: Gynecology

## 2015-01-03 ENCOUNTER — Ambulatory Visit (INDEPENDENT_AMBULATORY_CARE_PROVIDER_SITE_OTHER): Payer: BLUE CROSS/BLUE SHIELD | Admitting: Nurse Practitioner

## 2015-01-03 VITALS — BP 134/82 | HR 58 | Temp 97.8°F | Resp 14 | Ht 63.0 in | Wt 117.0 lb

## 2015-01-03 DIAGNOSIS — J029 Acute pharyngitis, unspecified: Secondary | ICD-10-CM | POA: Diagnosis not present

## 2015-01-03 DIAGNOSIS — B9789 Other viral agents as the cause of diseases classified elsewhere: Secondary | ICD-10-CM

## 2015-01-03 DIAGNOSIS — J028 Acute pharyngitis due to other specified organisms: Principal | ICD-10-CM

## 2015-01-03 NOTE — Progress Notes (Signed)
Patient ID: Tanya Fox, female    DOB: May 21, 1950  Age: 64 y.o. MRN: [id number]  CC: Sore Throat   HPI Tanya Fox presents for sore throat x 5 days.   1) Sore throat x 5 days with headache. Feels sides of throat and ears feel tender externally. Pt feels grooves on side of tongue and sore.  Cold and sinus tylenol- not helpful  Dukes mouthwash- not helpful  History Tanya Fox has a past medical history of Atrophic vaginitis; Osteopenia (11/2012); BRCA negative; and Breast cancer (2003).   She has past surgical history that includes Cesarean section; Tubal ligation; and Breast surgery (2003-2004).   Her family history includes Alcohol abuse in her father; Breast cancer in her mother and sister; Cancer in her maternal grandfather; Diabetes in her paternal grandmother; Heart disease in her father and maternal grandfather; Hypertension in her mother.She reports that she has never smoked. She has never used smokeless tobacco. She reports that she drinks alcohol. She reports that she does not use illicit drugs.  Outpatient Prescriptions Prior to Visit  Medication Sig Dispense Refill  . Ascorbic Acid (VITAMIN C PO) Take by mouth.      . Calcium Carbonate-Vitamin D (CALCIUM + D PO) Take by mouth.      . Estradiol (VAGIFEM) 10 MCG TABS vaginal tablet INSERT 1 TABLET VAGINALLY TWO TIMES A WEEK 24 tablet 4  . fluconazole (DIFLUCAN) 150 MG tablet One tablet by mouth as needed for yeast 2 tablet 0  . meloxicam (MOBIC) 7.5 MG tablet Take one tablet once daily as needed 20 tablet 0  . Multiple Vitamin (MULTIVITAMIN) tablet Take 1 tablet by mouth daily.      Marland Kitchen sulfamethoxazole-trimethoprim (BACTRIM DS,SEPTRA DS) 800-160 MG per tablet Take 1 tablet by mouth 2 (two) times daily. 6 tablet 0  . VITAMIN E PO Take by mouth.       No facility-administered medications prior to visit.    ROS Review of Systems  Constitutional: Negative for fever, chills, diaphoresis and fatigue.  HENT: Positive for  mouth sores and sore throat.   Gastrointestinal: Negative for nausea, vomiting and diarrhea.  Neurological: Positive for headaches.    Objective:  BP 134/82 mmHg  Pulse 58  Temp(Src) 97.8 F (36.6 C)  Resp 14  Ht _0  (1.6 m)  Wt 117 lb (53.071 kg)  BMI 20.73 kg/m2  SpO2 98%  Physical Exam  Constitutional: She is oriented to person, place, and time. She appears well-developed and well-nourished. No distress.  HENT:  Head: Normocephalic and atraumatic.  Right Ear: External ear normal.  Left Ear: External ear normal.  Mouth/Throat: Oropharynx is clear and moist. No oropharyngeal exudate.  TM's clear bilaterally  Eyes: EOM are normal. Pupils are equal, round, and reactive to light. Right eye exhibits no discharge. Left eye exhibits no discharge. No scleral icterus.  Neck: Normal range of motion. Neck supple. No thyromegaly present.  Cardiovascular: Normal rate, regular rhythm and normal heart sounds.   Pulmonary/Chest: Effort normal and breath sounds normal. No respiratory distress. She has no wheezes. She has no rales. She exhibits no tenderness.  Lymphadenopathy:    She has no cervical adenopathy.  Neurological: She is alert and oriented to person, place, and time. No cranial nerve deficit. She exhibits normal muscle tone. Coordination normal.  Skin: Skin is warm and dry. No rash noted. She is not diaphoretic.  Psychiatric: She has a normal mood and affect. Her behavior is normal. Judgment and thought  content normal.   Assessment & Plan:   Tanya Fox was seen today for sore throat.  Diagnoses and all orders for this visit:  Sore throat (viral)   I am having Ms. Tanya Fox maintain her multivitamin, VITAMIN E PO, Calcium Carbonate-Vitamin D (CALCIUM + D PO), Ascorbic Acid (VITAMIN C PO), Estradiol, sulfamethoxazole-trimethoprim, fluconazole, and meloxicam.  No orders of the defined types were placed in this encounter.     Follow-up: Return if symptoms worsen or fail to  improve.

## 2015-01-03 NOTE — Patient Instructions (Signed)

## 2015-01-03 NOTE — Progress Notes (Signed)
Pre visit review using our clinic review tool, if applicable. No additional management support is needed unless otherwise documented below in the visit note. 

## 2015-01-04 ENCOUNTER — Encounter: Payer: Self-pay | Admitting: Nurse Practitioner

## 2015-01-04 DIAGNOSIS — B9789 Other viral agents as the cause of diseases classified elsewhere: Secondary | ICD-10-CM | POA: Insufficient documentation

## 2015-01-04 DIAGNOSIS — J028 Acute pharyngitis due to other specified organisms: Principal | ICD-10-CM

## 2015-01-04 NOTE — Assessment & Plan Note (Signed)
Sore throat most likely viral. No POCT strep test done due to lack of Centor criteria. Continue conservative therapy. Pt was not acute today. Call if not improving somewhat in 48 hours.

## 2015-01-31 ENCOUNTER — Telehealth: Payer: Self-pay | Admitting: *Deleted

## 2015-01-31 NOTE — Telephone Encounter (Signed)
Please advise and I will refill?

## 2015-01-31 NOTE — Telephone Encounter (Signed)
Please clarify with pt how often she is taking meloxicam.  That way I will know how many to give her to last two months.  (the last rx she received was for 20 tablets and this was two months aqo).  Just let me know and will refill.

## 2015-01-31 NOTE — Telephone Encounter (Signed)
Patient will be out of town for two months and requested a two month Rx for meloxicam.

## 2015-02-01 ENCOUNTER — Other Ambulatory Visit: Payer: Self-pay | Admitting: Internal Medicine

## 2015-02-01 NOTE — Telephone Encounter (Signed)
Spoke with patients husband she will call back tomorrow with the number.

## 2015-02-02 ENCOUNTER — Telehealth: Payer: Self-pay | Admitting: Internal Medicine

## 2015-02-02 NOTE — Telephone Encounter (Signed)
Left another message to call back with how often she is taking her medication

## 2015-02-02 NOTE — Telephone Encounter (Signed)
Husband called back and she is requesting two months of pills.

## 2015-02-02 NOTE — Telephone Encounter (Signed)
Pt called about only getting 1 month of the meloxicam (MOBIC) 7.5 MG tablet. Pt states it should have been 2 months. Pharmacy is [facility]. Thank you!

## 2015-02-02 NOTE — Telephone Encounter (Signed)
Ok to refill x one more month.   Previous message was to clarify how often she was taking the medication.  I am ok to refill for 2 months total.

## 2015-02-03 ENCOUNTER — Encounter: Payer: Self-pay | Admitting: *Deleted

## 2015-02-03 NOTE — Telephone Encounter (Signed)
Sent mychart message

## 2015-02-28 ENCOUNTER — Telehealth: Payer: Self-pay | Admitting: Internal Medicine

## 2015-02-28 NOTE — Telephone Encounter (Signed)
Please advise 

## 2015-02-28 NOTE — Telephone Encounter (Signed)
Below the waist on the hip, believes it is because she is carrying the baby all the time. (28lbs, up and down the stairs)  Will not be back until the end of January.  Please advise?

## 2015-02-28 NOTE — Telephone Encounter (Signed)
Pt called about needing another type of medication for the pain. The medication that she's on is not working meloxicam (MOBIC) 7.5 MG tablet. Pt thinks it coming from carrying the baby. Pharmacy is [id number]. Pt is in [place]. Thank you!

## 2015-02-28 NOTE — Telephone Encounter (Signed)
If she is having increased pain and needing something stronger then mobic, the needs to be evaluated.  I would recommend evaluation where she is located and then we can f/u after return.

## 2015-02-28 NOTE — Telephone Encounter (Signed)
If she is having increased pain that mobic is not helping, she needs to be evaluated so can determine etiology of pain.

## 2015-03-01 ENCOUNTER — Encounter: Payer: Self-pay | Admitting: Internal Medicine

## 2015-03-02 NOTE — Telephone Encounter (Signed)
Please try and call pt and let her know that if she wants to try the mobic on a daily basis for a brief period, she can.  Needs to monitor for GI side effects.  She really needs to have her kidney function (blood test) checked, if she is going to stay on regularly.  If the daily mobic is not helping, then I really think she needs to be evaluated.  May need further scanning or testing and possibly other medication pending results.  I really think she needs to be evaluated.  Let me know if any problems.  Pt provided a number to reach her.  Thanks    Dr Nicki Reaper

## 2015-03-03 ENCOUNTER — Other Ambulatory Visit: Payer: Self-pay | Admitting: *Deleted

## 2015-03-03 MED ORDER — MELOXICAM 7.5 MG PO TABS: ORAL_TABLET | ORAL | Status: DC

## 2015-03-03 NOTE — Telephone Encounter (Signed)
I am ok to refill the mobic 7.5mg  q day prn #30 with no refills.  I do not know how to get the rx to her.  Can see if she has number to the [facility] that Tonya referenced and call in if possible.  Let me know if any problems.  May need pharmacy to transfer.   Thanks

## 2015-03-06 ENCOUNTER — Other Ambulatory Visit: Payer: Self-pay

## 2015-03-06 MED ORDER — MELOXICAM 7.5 MG PO TABS
ORAL_TABLET | ORAL | Status: DC
Start: 2015-03-06 — End: 2015-07-11

## 2015-03-08 ENCOUNTER — Encounter: Payer: Self-pay | Admitting: *Deleted

## 2015-03-23 ENCOUNTER — Encounter: Payer: Commercial Indemnity | Admitting: Internal Medicine

## 2015-06-02 ENCOUNTER — Encounter: Payer: Self-pay | Admitting: Internal Medicine

## 2015-06-28 ENCOUNTER — Encounter: Payer: Self-pay | Admitting: Internal Medicine

## 2015-07-11 ENCOUNTER — Encounter: Payer: Self-pay | Admitting: Family Medicine

## 2015-07-11 ENCOUNTER — Ambulatory Visit (INDEPENDENT_AMBULATORY_CARE_PROVIDER_SITE_OTHER): Payer: BLUE CROSS/BLUE SHIELD | Admitting: Family Medicine

## 2015-07-11 VITALS — BP 122/74 | HR 80 | Temp 98.1°F | Wt 112.5 lb

## 2015-07-11 DIAGNOSIS — R1032 Left lower quadrant pain: Secondary | ICD-10-CM | POA: Diagnosis not present

## 2015-07-11 DIAGNOSIS — R1030 Lower abdominal pain, unspecified: Secondary | ICD-10-CM | POA: Insufficient documentation

## 2015-07-11 MED ORDER — MELOXICAM 7.5 MG PO TABS: ORAL_TABLET | ORAL | Status: DC

## 2015-07-11 NOTE — Progress Notes (Signed)
Pre visit review using our clinic review tool, if applicable. No additional management support is needed unless otherwise documented below in the visit note. 

## 2015-07-11 NOTE — Patient Instructions (Signed)
Take the mobic as prescribed.  We will call with the xray results.  Take care  Dr. Lacinda Axon

## 2015-07-11 NOTE — Assessment & Plan Note (Signed)
New problem. Given location, there is concern for hip pathology. Treating with Mobic and obtaining xray.

## 2015-07-11 NOTE — Progress Notes (Signed)
Subjective:  Patient ID: Tanya Fox, female    DOB: 1950-12-12  Age: 65 y.o. MRN: [id number]  CC: Hip/groin pain  HPI:  65 year old female presents with complaints of hip/groin pain.  Patient states that since the first week of February she's had left groin pain which radiates to the lateral hip and proceeds down the thigh to the foot. Pain is moderate to severe. She reports that she has recently been taking care of a grandchild and carrying the child often. Pain is worse at night. She's been taking Tylenol with little improvement. No known exacerbating factors. She does have a history of back pain but denies any back pain at this time.  Social Hx   Social History   Social History  . Marital Status: Married    Spouse Name: N/A  . Number of Children: 2  . Years of Education: N/A   Occupational History  .     Social History Main Topics  . Smoking status: Never Smoker   . Smokeless tobacco: Never Used  . Alcohol Use: 0.0 oz/week    0 Standard drinks or equivalent per week     Comment: very rare  . Drug Use: No  . Sexual Activity: Yes    Birth Control/ Protection: Surgical     Comment: BTL-1st intercourse 66 yo-1 partner   Other Topics Concern  . None   Social History Narrative   Review of Systems  Constitutional: Negative.   Musculoskeletal:       Groin/hip pain (left)    Objective:  BP 122/74 mmHg  Pulse 80  Temp(Src) 98.1 F (36.7 C) (Oral)  Wt 112 lb 8 oz (51.03 kg)  SpO2 96%  BP/Weight 07/11/2015 [id number]  Systolic BP [id number] [id number]  Diastolic BP 74 82 76  Wt. (Lbs) 112.5 117 112  BMI 19.93 20.73 19.84    Physical Exam  Constitutional: She is oriented to person, place, and time. She appears well-developed. No distress.  Pulmonary/Chest: Effort normal.  Musculoskeletal:  ? Positive straight leg raise. Left groin tender to palpation. No greater trochanter tenderness. Pain with FABER and FADIR.  Neurological: She is alert and oriented to  person, place, and time.  Psychiatric: She has a normal mood and affect.  Vitals reviewed.   Lab Results  Component Value Date   WBC 4.3 09/21/2014   HGB 13.4 09/21/2014   HCT 39.4 09/21/2014   PLT 192.0 09/21/2014   GLUCOSE 71 03/18/2014   CHOL 175 03/18/2014   TRIG 57.0 03/18/2014   HDL 72.40 03/18/2014   LDLCALC 91 03/18/2014   ALT 18 03/18/2014   AST 26 03/18/2014   NA 141 03/18/2014   K 4.2 03/18/2014   CL 106 03/18/2014   CREATININE 0.8 03/18/2014   BUN 15 03/18/2014   CO2 26 03/18/2014   TSH 1.61 03/18/2014    Assessment & Plan:   Problem List Items Addressed This Visit    Groin pain - Primary    New problem. Given location, there is concern for hip pathology. Treating with Mobic and obtaining xray.       Relevant Orders   DG HIP UNILAT WITH PELVIS 1V LEFT      Meds ordered this encounter  Medications  . meloxicam (MOBIC) 7.5 MG tablet    Sig: TAKE 1 TABLET EVERY DAY AS NEEDED    Dispense:  30 tablet    Refill:  0    Follow-up: PRN  Thersa Salt DO [facility]  [employer]

## 2015-07-12 ENCOUNTER — Ambulatory Visit (INDEPENDENT_AMBULATORY_CARE_PROVIDER_SITE_OTHER)
Admission: RE | Admit: 2015-07-12 | Discharge: 2015-07-12 | Disposition: A | Discharge status: Home / Self Care | Payer: BLUE CROSS/BLUE SHIELD | Source: Ambulatory Visit | Attending: Family Medicine | Admitting: Family Medicine

## 2015-07-12 DIAGNOSIS — R1032 Left lower quadrant pain: Secondary | ICD-10-CM

## 2015-07-15 ENCOUNTER — Encounter: Payer: Self-pay | Admitting: Internal Medicine

## 2015-07-15 DIAGNOSIS — R1032 Left lower quadrant pain: Secondary | ICD-10-CM

## 2015-07-15 DIAGNOSIS — M25552 Pain in left hip: Secondary | ICD-10-CM

## 2015-07-18 NOTE — Telephone Encounter (Signed)
Order placed for ortho referral per my chart message.

## 2015-07-21 ENCOUNTER — Encounter: Payer: Managed Care, Other (non HMO) | Admitting: Gynecology

## 2015-08-09 ENCOUNTER — Encounter: Payer: Self-pay | Admitting: Internal Medicine

## 2015-08-21 ENCOUNTER — Encounter: Payer: Self-pay | Admitting: Internal Medicine

## 2015-08-23 ENCOUNTER — Encounter: Payer: Self-pay | Admitting: Internal Medicine

## 2015-08-23 ENCOUNTER — Ambulatory Visit (INDEPENDENT_AMBULATORY_CARE_PROVIDER_SITE_OTHER): Payer: BLUE CROSS/BLUE SHIELD | Admitting: Internal Medicine

## 2015-08-23 VITALS — BP 128/80 | HR 74 | Temp 97.9°F | Resp 18 | Ht 64.0 in | Wt 110.0 lb

## 2015-08-23 DIAGNOSIS — C801 Malignant (primary) neoplasm, unspecified: Secondary | ICD-10-CM

## 2015-08-23 DIAGNOSIS — Z0000 Encounter for general adult medical examination without abnormal findings: Secondary | ICD-10-CM

## 2015-08-23 DIAGNOSIS — M545 Low back pain, unspecified: Secondary | ICD-10-CM

## 2015-08-23 DIAGNOSIS — R1032 Left lower quadrant pain: Secondary | ICD-10-CM | POA: Diagnosis not present

## 2015-08-23 DIAGNOSIS — D72819 Decreased white blood cell count, unspecified: Secondary | ICD-10-CM | POA: Diagnosis not present

## 2015-08-23 DIAGNOSIS — M8580 Other specified disorders of bone density and structure, unspecified site: Secondary | ICD-10-CM

## 2015-08-23 DIAGNOSIS — Z13220 Encounter for screening for lipoid disorders: Secondary | ICD-10-CM

## 2015-08-23 NOTE — Progress Notes (Signed)
Patient ID: Sharen Hones, female   DOB: 05-Dec-1950, 65 y.o.   MRN: [id number]   Subjective:    Patient ID: Sharen Hones, female    DOB: 02/06/1951, 65 y.o.   MRN: [id number]  HPI  Patient here for her physical exam.  Sees gyn for her breast and pelvic exams.  She has been having issues with left groin pain and left leg pain - previous back issues.  Saw Dr Ronnald Ramp.  Going to therapy.  This has helped.  Taking mobic 5m per day now.  Is doing better.  Stays active.  No chest pain.  No sob.  No abdominal pain or cramping.  Bowels stable.  She is eating.  Lost weight.     Past Medical History  Diagnosis Date  . Atrophic vaginitis   . Osteopenia 11/2012    T score -1.2 FRAX 6.6%/0.5%  . BRCA negative   . [place]    Left side, Stage I   Past Surgical History  Procedure Laterality Date  . Cesarean section      X 2  . Tubal ligation    . Breast surgery  2003-2004    Lumpectomy-Lymph nodes-Port a cath-Radiation and Chemo   Family History  Problem Relation Age of Onset  . Hypertension Mother   . Breast cancer Mother     Age 63 . Breast cancer Sister     Age 63 . Cancer Maternal Grandfather     Colon cancer  . Heart disease Maternal Grandfather   . Diabetes Paternal Grandmother   . Heart disease Father   . Alcohol abuse Father    Social History   Social History  . Marital Status: Married    Spouse Name: N/A  . Number of Children: 2  . Years of Education: N/A   Occupational History  .     Social History Main Topics  . Smoking status: Never Smoker   . Smokeless tobacco: Never Used  . Alcohol Use: 0.0 oz/week    0 Standard drinks or equivalent per week     Comment: very rare  . Drug Use: No  . Sexual Activity: Yes    Birth Control/ Protection: Surgical     Comment: BTL-1st intercourse 89yo-1 partner   Other Topics Concern  . None   Social History Narrative    Outpatient Encounter Prescriptions as of 08/23/2015  Medication Sig  .  meloxicam (MOBIC) 7.5 MG tablet TAKE 1 TABLET EVERY DAY AS NEEDED  . Multiple Vitamin (MULTIVITAMIN) tablet Take 1 tablet by mouth daily.    . [DISCONTINUED] Ascorbic Acid (VITAMIN C PO) Take by mouth. Reported on 08/23/2015  . [DISCONTINUED] Calcium Carbonate-Vitamin D (CALCIUM + D PO) Take by mouth. Reported on 08/23/2015  . [DISCONTINUED] Estradiol (VAGIFEM) 10 MCG TABS vaginal tablet INSERT 1 TABLET VAGINALLY TWO TIMES A WEEK (Patient not taking: Reported on 08/23/2015)  . [DISCONTINUED] fluconazole (DIFLUCAN) 150 MG tablet One tablet by mouth as needed for yeast (Patient not taking: Reported on 08/23/2015)  . [DISCONTINUED] sulfamethoxazole-trimethoprim (BACTRIM DS,SEPTRA DS) 800-160 MG per tablet Take 1 tablet by mouth 2 (two) times daily. (Patient not taking: Reported on 08/23/2015)  . [DISCONTINUED] VITAMIN E PO Take by mouth. Reported on 08/23/2015   No facility-administered encounter medications on file as of 08/23/2015.    Review of Systems  Constitutional: Negative for appetite change.       Has lost weight.   HENT: Negative for congestion and  sinus pressure.   Eyes: Negative for pain and visual disturbance.  Respiratory: Negative for cough, chest tightness and shortness of breath.   Cardiovascular: Negative for chest pain, palpitations and leg swelling.  Gastrointestinal: Negative for nausea, vomiting, abdominal pain and diarrhea.  Genitourinary: Negative for dysuria and difficulty urinating.  Musculoskeletal: Positive for back pain.       Left groin and leg pain.   Skin: Negative for color change and rash.  Neurological: Negative for dizziness, light-headedness and headaches.  Hematological: Negative for adenopathy. Does not bruise/bleed easily.  Psychiatric/Behavioral: Negative for dysphoric mood and agitation.       Objective:    Physical Exam  Constitutional: She appears well-developed and well-nourished.  HENT:  Nose: Nose normal.  Mouth/Throat: Oropharynx is clear and  moist.  Neck: Neck supple. No thyromegaly present.  Cardiovascular: Normal rate and regular rhythm.   Pulmonary/Chest: Breath sounds normal. No respiratory distress. She has no wheezes.  Abdominal: Soft. Bowel sounds are normal. There is no tenderness.  Musculoskeletal: She exhibits no edema or tenderness.  Lymphadenopathy:    She has no cervical adenopathy.  Skin: No rash noted. No erythema.  Psychiatric: She has a normal mood and affect. Her behavior is normal.    BP 128/80 mmHg  Pulse 74  Temp(Src) 97.9 F (36.6 C) (Oral)  Resp 18  Ht _0  (1.626 m)  Wt 110 lb (49.896 kg)  BMI 18.87 kg/m2  SpO2 97% Wt Readings from Last 3 Encounters:  08/23/15 110 lb (49.896 kg)  07/11/15 112 lb 8 oz (51.03 kg)  01/03/15 117 lb (53.071 kg)     Lab Results  Component Value Date   WBC 4.3 09/21/2014   HGB 13.4 09/21/2014   HCT 39.4 09/21/2014   PLT 192.0 09/21/2014   GLUCOSE 71 03/18/2014   CHOL 175 03/18/2014   TRIG 57.0 03/18/2014   HDL 72.40 03/18/2014   LDLCALC 91 03/18/2014   ALT 18 03/18/2014   AST 26 03/18/2014   NA 141 03/18/2014   K 4.2 03/18/2014   CL 106 03/18/2014   CREATININE 0.8 03/18/2014   BUN 15 03/18/2014   CO2 26 03/18/2014   TSH 1.61 03/18/2014    Dg Hip Unilat With Pelvis 1v Left  07/12/2015  CLINICAL DATA:  Left hip/ groin pain x 2 months, no falls, no trauma, no priors. EXAM: DG HIP (WITH OR WITHOUT PELVIS) 1V*L* COMPARISON:  None. FINDINGS: Mild cartilage loss in the left hip with mild superior migration of the femoral head. Small marginal spurs from the acetabulum and femoral head. Subchondral sclerosis and small cysts or geodes. No fracture or dislocation. Pelvic phleboliths and tubal ligation clips noted. IMPRESSION: Left hip osteoarthritis. Electronically Signed   By: Lucrezia Europe M.D.   On: 07/12/2015 10:15       Assessment & Plan:   Problem List Items Addressed This Visit    Back pain - Primary    Back is doing better.  Left leg better.  Saw  neurosurgery.  Going to therapy.  Follow.       Cancer [facility])    Followed by oncology.  They are doing her mammograms.        Groin pain    Better.  Seeing neurosurgery.  Therapy helping.  Follow.       Health care maintenance    Colonoscopy 07/30/12 - internal hemorrhoids.  Gets breast and pelvic exams through gyn.  Mammogram in the fall.  Followed by oncology.  Leukopenia    Follow cbc.       Relevant Orders   Comprehensive metabolic panel   TSH   Osteopenia   Relevant Orders   VITAMIN D 25 Hydroxy (Vit-D Deficiency, Fractures)    Other Visit Diagnoses    Screening cholesterol level        Relevant Orders    Lipid panel        Einar Pheasant, MD

## 2015-08-23 NOTE — Progress Notes (Signed)
Pre-visit discussion using our clinic review tool. No additional management support is needed unless otherwise documented below in the visit note.  

## 2015-08-28 ENCOUNTER — Encounter: Payer: Self-pay | Admitting: Internal Medicine

## 2015-08-28 ENCOUNTER — Encounter: Payer: Self-pay | Admitting: Gynecology

## 2015-08-28 ENCOUNTER — Ambulatory Visit (INDEPENDENT_AMBULATORY_CARE_PROVIDER_SITE_OTHER): Payer: BLUE CROSS/BLUE SHIELD | Admitting: Gynecology

## 2015-08-28 VITALS — BP 120/74 | Ht 64.0 in | Wt 109.0 lb

## 2015-08-28 DIAGNOSIS — Z01419 Encounter for gynecological examination (general) (routine) without abnormal findings: Secondary | ICD-10-CM | POA: Diagnosis not present

## 2015-08-28 DIAGNOSIS — N952 Postmenopausal atrophic vaginitis: Secondary | ICD-10-CM

## 2015-08-28 DIAGNOSIS — C50912 Malignant neoplasm of unspecified site of left female breast: Secondary | ICD-10-CM | POA: Diagnosis not present

## 2015-08-28 DIAGNOSIS — Z0000 Encounter for general adult medical examination without abnormal findings: Secondary | ICD-10-CM | POA: Insufficient documentation

## 2015-08-28 NOTE — Assessment & Plan Note (Signed)
Colonoscopy 07/30/12 - internal hemorrhoids.  Gets breast and pelvic exams through gyn.  Mammogram in the fall.  Followed by oncology.

## 2015-08-28 NOTE — Assessment & Plan Note (Signed)
Follow cbc.  

## 2015-08-28 NOTE — Assessment & Plan Note (Signed)
Back is doing better.  Left leg better.  Saw neurosurgery.  Going to therapy.  Follow.

## 2015-08-28 NOTE — Assessment & Plan Note (Signed)
Followed by oncology.  They are doing her mammograms.

## 2015-08-28 NOTE — Patient Instructions (Signed)

## 2015-08-28 NOTE — Progress Notes (Signed)
    Kamilya Ditoro 07/24/1950 [id number]        65 y.o.  G2P2002  for annual exam.  Doing well.  Past medical history,surgical history, problem list, medications, allergies, family history and social history were all reviewed and documented as reviewed in the EPIC chart.  ROS:  Performed with pertinent positives and negatives included in the history, assessment and plan.   Additional significant findings :  none   Exam: Caryn Bee assistant Filed Vitals:   08/28/15 1054  BP: 120/74  Height: 5\' 4"  (1.626 m)  Weight: 109 lb (49.442 kg)   General appearance:  Normal affect, orientation and appearance. Skin: Grossly normal HEENT: Without gross lesions.  No cervical or supraclavicular adenopathy. Thyroid normal.  Lungs:  Clear without wheezing, rales or rhonchi Cardiac: RR, without RMG Abdominal:  Soft, nontender, without masses, guarding, rebound, organomegaly or hernia Breasts:  Examined lying and sitting without masses, retractions, discharge or axillary adenopathy. Pelvic:  Ext/BUS/vagina with atrophic changes  Cervix with atrophic changes  Uterus anteverted, normal size, shape and contour, midline and mobile nontender   Adnexa without masses or tenderness    Anus and perineum normal   Rectovaginal normal sphincter tone without palpated masses or tenderness.    Assessment/Plan:  65 y.o. G51P2002 female for annual exam.   1. Postmenopausal/atrophic genital changes. Had started on Vagifem and continued for about one year.  Has discontinued it is doing well without significant vaginal dryness or discomfort. Otherwise not having significant hot flashes or night sweats. No vaginal bleeding. Will continue to follow and call if any issues or vaginal bleeding. 2. Left sided breast cancer stage I 2003. Exam NED. Mammography 12/2014. Continue with annual mammography when due. SBE monthly reviewed. 3. Osteopenia. DEXA 2014 T score -1.2 FRAX 6.6%/0.5%. Will plan repeat DEXA next year at  four-year interval. Increased calcium vitamin D. 4. Pap smear/HPV 2014. No Pap smear done today. No history of significant abnormal Pap smears. Plan repeat Pap smear at 5 year interval per current screening guidelines. 5. Colonoscopy 2014. Repeat at their recommended interval. 6. Health maintenance. No routine lab work done as this is done at her primary physician's office. Follow up 1 year, sooner as needed.   [name], 11:26 AM 08/28/2015

## 2015-08-28 NOTE — Assessment & Plan Note (Signed)
Better.  Seeing neurosurgery.  Therapy helping.  Follow.

## 2015-09-08 ENCOUNTER — Other Ambulatory Visit (INDEPENDENT_AMBULATORY_CARE_PROVIDER_SITE_OTHER): Payer: BLUE CROSS/BLUE SHIELD

## 2015-09-08 DIAGNOSIS — Z13220 Encounter for screening for lipoid disorders: Secondary | ICD-10-CM | POA: Diagnosis not present

## 2015-09-08 DIAGNOSIS — M8580 Other specified disorders of bone density and structure, unspecified site: Secondary | ICD-10-CM

## 2015-09-08 DIAGNOSIS — D72819 Decreased white blood cell count, unspecified: Secondary | ICD-10-CM

## 2015-09-08 LAB — COMPREHENSIVE METABOLIC PANEL
ALBUMIN: 4.4 g/dL (ref 3.5–5.2)
ALK PHOS: 72 U/L (ref 39–117)
ALT: 12 U/L (ref 0–35)
AST: 17 U/L (ref 0–37)
BILIRUBIN TOTAL: 0.6 mg/dL (ref 0.2–1.2)
BUN: 18 mg/dL (ref 6–23)
CALCIUM: 9.6 mg/dL (ref 8.4–10.5)
CO2: 29 mEq/L (ref 19–32)
CREATININE: 0.61 mg/dL (ref 0.40–1.20)
Chloride: 106 mEq/L (ref 96–112)
GFR: 105.33 mL/min (ref >60.00)
Glucose, Bld: 84 mg/dL (ref 70–99)
Potassium: 4 mEq/L (ref 3.5–5.1)
Sodium: 141 mEq/L (ref 135–145)
TOTAL PROTEIN: 6.8 g/dL (ref 6.0–8.3)

## 2015-09-08 LAB — LIPID PANEL
CHOLESTEROL: 171 mg/dL (ref 0–200)
HDL: 64.8 mg/dL (ref >39.00)
LDL CALC: 90 mg/dL (ref 0–99)
NonHDL: 106.59
TRIGLYCERIDES: 85 mg/dL (ref 0.0–149.0)
Total CHOL/HDL Ratio: 3
VLDL: 17 mg/dL (ref 0.0–40.0)

## 2015-09-08 LAB — VITAMIN D 25 HYDROXY (VIT D DEFICIENCY, FRACTURES): VITD: 25.64 ng/mL — AB (ref 30.00–100.00)

## 2015-09-08 LAB — TSH: TSH: 1.04 u[IU]/mL (ref 0.35–4.50)

## 2015-09-11 ENCOUNTER — Encounter: Payer: Self-pay | Admitting: Internal Medicine

## 2015-09-14 NOTE — Telephone Encounter (Signed)
Unread mychart message mailed to patient 

## 2015-09-27 ENCOUNTER — Encounter: Payer: Self-pay | Admitting: Internal Medicine

## 2015-09-29 ENCOUNTER — Encounter: Payer: Self-pay | Admitting: Genetic Counselor

## 2015-12-20 ENCOUNTER — Encounter: Payer: Self-pay | Admitting: Gynecology

## 2015-12-27 DIAGNOSIS — M169 Osteoarthritis of hip, unspecified: Secondary | ICD-10-CM | POA: Insufficient documentation

## 2016-02-12 ENCOUNTER — Encounter: Payer: Self-pay | Admitting: Internal Medicine

## 2016-02-12 NOTE — Telephone Encounter (Signed)
Called pt and discussed.  She has appt with Dr Sabra Heck tomorrow. Plans to discuss with him more.

## 2016-02-14 ENCOUNTER — Telehealth: Payer: Self-pay | Admitting: *Deleted

## 2016-02-14 NOTE — Telephone Encounter (Signed)
Patient notified and will have them fax it

## 2016-02-14 NOTE — Telephone Encounter (Signed)
Have you received this?

## 2016-02-14 NOTE — Telephone Encounter (Signed)
No.  I have not seen this.  When I spoke to her, she did not have it scheduled.  Will need form if form is needed.

## 2016-02-14 NOTE — Telephone Encounter (Signed)
Patient questioned if Dr Nicki Reaper received her medical clarence form from [facility] orthopedic Pt contact   [phone]

## 2016-02-16 NOTE — Telephone Encounter (Signed)
Patient notified that we have received them and she will come in on 02/27/16

## 2016-02-16 NOTE — Telephone Encounter (Signed)
Patient has requested to know if her medical clarence forms were received. Pt has become frustrated. Pt contact [phone]

## 2016-02-27 ENCOUNTER — Ambulatory Visit (INDEPENDENT_AMBULATORY_CARE_PROVIDER_SITE_OTHER): Payer: BLUE CROSS/BLUE SHIELD | Admitting: Internal Medicine

## 2016-02-27 ENCOUNTER — Encounter: Payer: Self-pay | Admitting: Internal Medicine

## 2016-02-27 VITALS — BP 112/70 | HR 79 | Temp 97.5°F | Ht 64.0 in | Wt 114.0 lb

## 2016-02-27 DIAGNOSIS — M25552 Pain in left hip: Secondary | ICD-10-CM | POA: Diagnosis not present

## 2016-02-27 DIAGNOSIS — D72819 Decreased white blood cell count, unspecified: Secondary | ICD-10-CM | POA: Diagnosis not present

## 2016-02-27 DIAGNOSIS — Z01818 Encounter for other preprocedural examination: Secondary | ICD-10-CM | POA: Diagnosis not present

## 2016-02-27 DIAGNOSIS — M8580 Other specified disorders of bone density and structure, unspecified site: Secondary | ICD-10-CM

## 2016-02-27 NOTE — Progress Notes (Signed)
Pre visit review using our clinic review tool, if applicable. No additional management support is needed unless otherwise documented below in the visit note. 

## 2016-02-27 NOTE — Progress Notes (Signed)
Patient ID: Tanya Fox, female   DOB: 05-Oct-1950, 65 y.o.   MRN: [id number]   Subjective:    Patient ID: Tanya Fox, female    DOB: [birth year], 65 y.o.   MRN: [id number]  HPI  Patient here for pre op evaluation.  She has been having increased left hip pain that is limiting her activity.  She saw ortho.  Found to have bone on bone.  Planning for left hip surgery.  She states other than her hip she is doing well.  Feels good.  No chest pain.  Still tries to stay active and is active.  No sob.  No acid reflux.  No abdominal pain or cramping.  Bowels stable.  She had questions about the surgery.     Past Medical History:  Diagnosis Date  . Atrophic vaginitis   . BRCA negative   . Breast cancer ([place]) 2003   Left side, Stage I  . Osteopenia 11/2012   T score -1.2 FRAX 6.6%/0.5%   Past Surgical History:  Procedure Laterality Date  . BREAST SURGERY  2003-2004   Lumpectomy-Lymph nodes-Port a cath-Radiation and Chemo  . CESAREAN SECTION     X 2  . TUBAL LIGATION     Family History  Problem Relation Age of Onset  . Hypertension Mother   . Breast cancer Mother     Age 50  . Heart disease Father   . Alcohol abuse Father   . Breast cancer Sister     Age 79  . Cancer Maternal Grandfather     Colon cancer  . Heart disease Maternal Grandfather   . Diabetes Paternal Grandmother    Social History   Social History  . Marital status: Married    Spouse name: N/A  . Number of children: 2  . Years of education: N/A   Occupational History  .  [employer] History Main Topics  . Smoking status: Never Smoker  . Smokeless tobacco: Never Used  . Alcohol use 0.0 oz/week     Comment: very rare  . Drug use: No  . Sexual activity: Yes    Birth control/ protection: Surgical     Comment: BTL-1st intercourse 17 yo-1 partner   Other Topics Concern  . None   Social History Narrative  . None    Outpatient Encounter Prescriptions as of 02/27/2016  Medication Sig   . diclofenac (VOLTAREN) 75 MG EC tablet Take 75 mg by mouth 2 (two) times daily.  . Multiple Vitamin (MULTIVITAMIN) tablet Take 1 tablet by mouth daily.    . [DISCONTINUED] meloxicam (MOBIC) 7.5 MG tablet TAKE 1 TABLET EVERY DAY AS NEEDED   No facility-administered encounter medications on file as of 02/27/2016.     Review of Systems  Constitutional: Negative for appetite change and unexpected weight change.  HENT: Negative for congestion and sinus pressure.   Respiratory: Negative for cough, chest tightness and shortness of breath.   Cardiovascular: Negative for chest pain, palpitations and leg swelling.  Gastrointestinal: Negative for abdominal pain, diarrhea, nausea and vomiting.  Musculoskeletal: Negative for joint swelling.       Persistent left hip pain as outlined.    Neurological: Negative for dizziness, light-headedness and headaches.       Objective:    Physical Exam  Constitutional: She appears well-developed and well-nourished. No distress.  HENT:  Nose: Nose normal.  Mouth/Throat: Oropharynx is clear and moist.  Neck: Neck supple. No thyromegaly present.  Cardiovascular: Normal  rate and regular rhythm.   Pulmonary/Chest: Breath sounds normal. No respiratory distress. She has no wheezes.  Abdominal: Soft. Bowel sounds are normal. There is no tenderness.  Musculoskeletal: She exhibits no edema or tenderness.  Lymphadenopathy:    She has no cervical adenopathy.  Skin: No rash noted. No erythema.  Psychiatric: She has a normal mood and affect. Her behavior is normal.    BP 112/70   Pulse 79   Temp 97.5 F (36.4 C) (Oral)   Ht _0  (1.626 m)   Wt 114 lb (51.7 kg)   SpO2 96%   BMI 19.57 kg/m  Wt Readings from Last 3 Encounters:  02/27/16 114 lb (51.7 kg)  08/28/15 109 lb (49.4 kg)  08/23/15 110 lb (49.9 kg)     Lab Results  Component Value Date   WBC 4.3 09/21/2014   HGB 13.4 09/21/2014   HCT 39.4 09/21/2014   PLT 192.0 09/21/2014   GLUCOSE 84  09/08/2015   CHOL 171 09/08/2015   TRIG 85.0 09/08/2015   HDL 64.80 09/08/2015   LDLCALC 90 09/08/2015   ALT 12 09/08/2015   AST 17 09/08/2015   NA 141 09/08/2015   K 4.0 09/08/2015   CL 106 09/08/2015   CREATININE 0.61 09/08/2015   BUN 18 09/08/2015   CO2 29 09/08/2015   TSH 1.04 09/08/2015    Dg Hip Unilat With Pelvis 1v Left  Result Date: 07/12/2015 CLINICAL DATA:  Left hip/ groin pain x 2 months, no falls, no trauma, no priors. EXAM: DG HIP (WITH OR WITHOUT PELVIS) 1V*L* COMPARISON:  None. FINDINGS: Mild cartilage loss in the left hip with mild superior migration of the femoral head. Small marginal spurs from the acetabulum and femoral head. Subchondral sclerosis and small cysts or geodes. No fracture or dislocation. Pelvic phleboliths and tubal ligation clips noted. IMPRESSION: Left hip osteoarthritis. Electronically Signed   By: Lucrezia Europe M.D.   On: 07/12/2015 10:15       Assessment & Plan:   Problem List Items Addressed This Visit    Left hip pain    Persistent pain.  Saw ortho.  Has bone on bone.  Planning for surgery as outlined.  Limiting her activity.        Leukopenia    Last cbc wnl.  Per surgery note, cbc will be checked prior to her surgery.  Hold on obtaining labs today.        Osteopenia    Continue vitamin D.       Pre-op evaluation    She is active.  No cardiac symptoms with increased activity or exertion.  No sob.  EKG revealed SR with no acute ischemic changes.  I feel that she is at low risk from a cardiac standpoint to proceed with the planned surgery.  She will need close intra op and post op monitoring of her heart rate and blood pressure to avoid extremes.         Other Visit Diagnoses    Preop testing    -  Primary   Relevant Orders   EKG 12-Lead (Completed)       Einar Pheasant, MD

## 2016-02-28 ENCOUNTER — Encounter: Payer: Self-pay | Admitting: Internal Medicine

## 2016-02-28 DIAGNOSIS — M25552 Pain in left hip: Secondary | ICD-10-CM | POA: Insufficient documentation

## 2016-02-28 DIAGNOSIS — Z01818 Encounter for other preprocedural examination: Secondary | ICD-10-CM | POA: Insufficient documentation

## 2016-02-28 NOTE — Assessment & Plan Note (Signed)
She is active.  No cardiac symptoms with increased activity or exertion.  No sob.  EKG revealed SR with no acute ischemic changes.  I feel that she is at low risk from a cardiac standpoint to proceed with the planned surgery.  She will need close intra op and post op monitoring of her heart rate and blood pressure to avoid extremes.

## 2016-02-28 NOTE — Assessment & Plan Note (Signed)
Continue vitamin D.  

## 2016-02-28 NOTE — Assessment & Plan Note (Signed)
Persistent pain.  Saw ortho.  Has bone on bone.  Planning for surgery as outlined.  Limiting her activity.

## 2016-02-28 NOTE — Assessment & Plan Note (Signed)
Last cbc wnl.  Per surgery note, cbc will be checked prior to her surgery.  Hold on obtaining labs today.

## 2016-02-29 ENCOUNTER — Telehealth: Payer: Self-pay | Admitting: *Deleted

## 2016-02-29 NOTE — Telephone Encounter (Signed)
I will order once we receive form or you can clarify with them over the phone what they need.  Hold until receive form or until clarify what is needed.

## 2016-02-29 NOTE — Telephone Encounter (Signed)
Patient states she needs labs done here per [facility] .

## 2016-02-29 NOTE — Telephone Encounter (Signed)
Pt questioned if the labs needed by [facility] orthopedic was received via fax. jj Pt contact [phone]

## 2016-03-01 ENCOUNTER — Other Ambulatory Visit: Payer: Self-pay | Admitting: Internal Medicine

## 2016-03-01 DIAGNOSIS — Z01818 Encounter for other preprocedural examination: Secondary | ICD-10-CM

## 2016-03-01 NOTE — Progress Notes (Signed)
Order placed for pre-op labs.

## 2016-03-01 NOTE — Telephone Encounter (Signed)
Left message to notify we did receive the lab order

## 2016-03-01 NOTE — Telephone Encounter (Signed)
Pt called and wanted to know if we had received the lab order from her other doctor in order for them to be done here. Please advise, thank you!  Call pt @ [phone]

## 2016-03-04 DIAGNOSIS — C801 Malignant (primary) neoplasm, unspecified: Secondary | ICD-10-CM | POA: Insufficient documentation

## 2016-03-28 ENCOUNTER — Other Ambulatory Visit (INDEPENDENT_AMBULATORY_CARE_PROVIDER_SITE_OTHER): Payer: BLUE CROSS/BLUE SHIELD

## 2016-03-28 DIAGNOSIS — Z01818 Encounter for other preprocedural examination: Secondary | ICD-10-CM

## 2016-03-28 LAB — BASIC METABOLIC PANEL
BUN: 20 mg/dL (ref 6–23)
CHLORIDE: 103 meq/L (ref 96–112)
CO2: 31 mEq/L (ref 19–32)
Calcium: 9.2 mg/dL (ref 8.4–10.5)
Creatinine, Ser: 0.62 mg/dL (ref 0.40–1.20)
GFR: 103.19 mL/min (ref >60.00)
GLUCOSE: 77 mg/dL (ref 70–99)
POTASSIUM: 3.5 meq/L (ref 3.5–5.1)
Sodium: 140 mEq/L (ref 135–145)

## 2016-03-28 LAB — PROTIME-INR
INR: 1.2 ratio — ABNORMAL HIGH (ref 0.8–1.0)
Prothrombin Time: 12.6 s (ref 9.6–13.1)

## 2016-03-29 ENCOUNTER — Telehealth: Payer: Self-pay | Admitting: *Deleted

## 2016-03-29 LAB — CBC WITH DIFFERENTIAL/PLATELET
BASOS PCT: 0.4 % (ref 0.0–3.0)
Basophils Absolute: 0 10*3/uL (ref 0.0–0.1)
EOS PCT: 4.9 % (ref 0.0–5.0)
Eosinophils Absolute: 0.1 10*3/uL (ref 0.0–0.7)
HCT: 37.5 % (ref 36.0–46.0)
Hemoglobin: 12.8 g/dL (ref 12.0–15.0)
LYMPHS ABS: 1.1 10*3/uL (ref 0.7–4.0)
Lymphocytes Relative: 37.9 % (ref 12.0–46.0)
MCHC: 34.2 g/dL (ref 30.0–36.0)
MCV: 93.7 fl (ref 78.0–100.0)
MONOS PCT: 9.4 % (ref 3.0–12.0)
Monocytes Absolute: 0.3 10*3/uL (ref 0.1–1.0)
NEUTROS ABS: 1.4 10*3/uL (ref 1.4–7.7)
NEUTROS PCT: 47.4 % (ref 43.0–77.0)
Platelets: 215 10*3/uL (ref 150.0–400.0)
RBC: 4.01 Mil/uL (ref 3.87–5.11)
RDW: 13.7 % (ref 11.5–15.5)
WBC: 2.9 10*3/uL — ABNORMAL LOW (ref 4.0–10.5)

## 2016-03-29 NOTE — Telephone Encounter (Signed)
Patient stated that he will have surgery on 12/19-she requested to have her lab results faxed to [facility]  Please call patient when labs have been faxed  Pt contact [phone]

## 2016-04-01 ENCOUNTER — Other Ambulatory Visit: Payer: Self-pay | Admitting: Internal Medicine

## 2016-04-01 DIAGNOSIS — D72819 Decreased white blood cell count, unspecified: Secondary | ICD-10-CM

## 2016-04-01 NOTE — Telephone Encounter (Signed)
Lab faxed to [facility] at [phone].  Patient advised.

## 2016-04-01 NOTE — Progress Notes (Signed)
Order placed for f/u cbc.   

## 2016-04-16 ENCOUNTER — Other Ambulatory Visit (INDEPENDENT_AMBULATORY_CARE_PROVIDER_SITE_OTHER): Payer: BLUE CROSS/BLUE SHIELD

## 2016-04-16 DIAGNOSIS — D72819 Decreased white blood cell count, unspecified: Secondary | ICD-10-CM

## 2016-04-16 LAB — CBC WITH DIFFERENTIAL/PLATELET
Basophils Absolute: 0 10*3/uL (ref 0.0–0.1)
Basophils Relative: 0.3 % (ref 0.0–3.0)
EOS PCT: 1.4 % (ref 0.0–5.0)
Eosinophils Absolute: 0.1 10*3/uL (ref 0.0–0.7)
HCT: 37.8 % (ref 36.0–46.0)
Hemoglobin: 12.9 g/dL (ref 12.0–15.0)
LYMPHS ABS: 1.5 10*3/uL (ref 0.7–4.0)
Lymphocytes Relative: 23.6 % (ref 12.0–46.0)
MCHC: 34.1 g/dL (ref 30.0–36.0)
MCV: 94.7 fl (ref 78.0–100.0)
MONOS PCT: 8 % (ref 3.0–12.0)
Monocytes Absolute: 0.5 10*3/uL (ref 0.1–1.0)
NEUTROS PCT: 66.7 % (ref 43.0–77.0)
Neutro Abs: 4.2 10*3/uL (ref 1.4–7.7)
Platelets: 221 10*3/uL (ref 150.0–400.0)
RBC: 4 Mil/uL (ref 3.87–5.11)
RDW: 13.2 % (ref 11.5–15.5)
WBC: 6.3 10*3/uL (ref 4.0–10.5)

## 2016-04-17 ENCOUNTER — Encounter: Payer: Self-pay | Admitting: Internal Medicine

## 2016-06-10 ENCOUNTER — Encounter: Payer: Self-pay | Admitting: Family Medicine

## 2016-06-10 ENCOUNTER — Ambulatory Visit (INDEPENDENT_AMBULATORY_CARE_PROVIDER_SITE_OTHER): Payer: BLUE CROSS/BLUE SHIELD | Admitting: Family Medicine

## 2016-06-10 DIAGNOSIS — B309 Viral conjunctivitis, unspecified: Secondary | ICD-10-CM | POA: Diagnosis not present

## 2016-06-10 NOTE — Progress Notes (Signed)
Pre visit review using our clinic review tool, if applicable. No additional management support is needed unless otherwise documented below in the visit note. 

## 2016-06-10 NOTE — Patient Instructions (Signed)
Nice to meet you. You likely have viral conjunctivitis. You may treat this with topical over-the-counter lubricating ointments and topical over-the-counter antihistamine eyedrops. These will help treat the symptoms that you have. Your symptoms should start to improve over a period of 3-5 days. If you develop eye pain, vision changes, light sensitivity, or any new or changing symptoms please seek medical attention immediately.

## 2016-06-10 NOTE — Progress Notes (Signed)
  Tommi Rumps, MD Phone: [phone]  Tanya Fox is a 66 y.o. female who presents today for same-day visit.  Patient reports 1 day of left eye itching and drainage. Notes the eye was matted shut yesterday and has been running with clear liquid. There has been no purulent drainage. She notes she came in contact with her grandson who had similar symptoms previously. She notes no photophobia, foreign-body sensation, eye pain, or vision changes. She's not tried anything for this. She notes no upper respiratory symptoms.  ROS see history of present illness  Objective  Physical Exam Vitals:   06/10/16 1118  BP: 112/80  Pulse: 66  Temp: 98.2 F (36.8 C)    BP Readings from Last 3 Encounters:  06/10/16 112/80  02/27/16 112/70  08/28/15 120/74   Wt Readings from Last 3 Encounters:  06/10/16 115 lb (52.2 kg)  02/27/16 114 lb (51.7 kg)  08/28/15 109 lb (49.4 kg)    Physical Exam  Constitutional: No distress.  HENT:  Head: Normocephalic and atraumatic.  Mouth/Throat: Oropharynx is clear and moist. No oropharyngeal exudate.  Eyes: Pupils are equal, round, and reactive to light.  Mild conjunctival erythema with mild clear discharge of the left eye, no gross corneal abnormalities noted, right eye appears normal  Cardiovascular: Normal rate, regular rhythm and normal heart sounds.   Pulmonary/Chest: Effort normal and breath sounds normal.  Neurological: She is alert. Gait normal.  Skin: She is not diaphoretic.     Assessment/Plan: Please see individual problem list.  Viral conjunctivitis of left eye Symptoms and exam most consistent with viral conjunctivitis particularly given recent exposure. No findings consistent with bacterial issue. No red flags. Vision checked and normal. Discussed symptomatic management with topical lubricating ointment and topical antihistamines over-the-counter. Discussed the natural course of viral conjunctivitis with her. If not improving over  the next week or so she will follow-up. Given return precautions.   Tommi Rumps, MD [place]

## 2016-06-10 NOTE — Assessment & Plan Note (Signed)
Symptoms and exam most consistent with viral conjunctivitis particularly given recent exposure. No findings consistent with bacterial issue. No red flags. Vision checked and normal. Discussed symptomatic management with topical lubricating ointment and topical antihistamines over-the-counter. Discussed the natural course of viral conjunctivitis with her. If not improving over the next week or so she will follow-up. Given return precautions.

## 2016-08-26 ENCOUNTER — Encounter: Payer: Self-pay | Admitting: Internal Medicine

## 2016-08-26 ENCOUNTER — Ambulatory Visit (INDEPENDENT_AMBULATORY_CARE_PROVIDER_SITE_OTHER): Payer: No Typology Code available for payment source | Admitting: Internal Medicine

## 2016-08-26 VITALS — BP 110/78 | HR 67 | Temp 97.6°F | Resp 12 | Ht 63.78 in | Wt 113.0 lb

## 2016-08-26 DIAGNOSIS — M545 Low back pain, unspecified: Secondary | ICD-10-CM

## 2016-08-26 DIAGNOSIS — D72819 Decreased white blood cell count, unspecified: Secondary | ICD-10-CM

## 2016-08-26 DIAGNOSIS — Z0000 Encounter for general adult medical examination without abnormal findings: Secondary | ICD-10-CM

## 2016-08-26 DIAGNOSIS — Z853 Personal history of malignant neoplasm of breast: Secondary | ICD-10-CM | POA: Diagnosis not present

## 2016-08-26 NOTE — Progress Notes (Signed)
Patient ID: Tanya Fox, female   DOB: 02-02-1951, 66 y.o.   MRN: [id number]   Subjective:    Patient ID: Tanya Fox, female    DOB: 1951-01-04, 66 y.o.   MRN: [id number]  HPI  Patient here for her physical exam. Sees gyn for her pelvic exams.  They also schedule her mammograms.  Last mammogram ok 12/2015.  Recommended f/u in one year.  Stays active.  Recent hip surgery.  Doing well.  Some stiffness when she sits for a long period, but once she starts moving - ok.  Back to exercising.  No chest pain.  No sob.  No acid reflux.  No abdominal pain.  Bowels stable.     Past Medical History:  Diagnosis Date  . Atrophic vaginitis   . BRCA negative   . Breast cancer ([place]) 2003   Left side, Stage I  . Osteopenia 11/2012   T score -1.2 FRAX 6.6%/0.5%   Past Surgical History:  Procedure Laterality Date  . BREAST SURGERY  2003-2004   Lumpectomy-Lymph nodes-Port a cath-Radiation and Chemo  . CESAREAN SECTION     X 2  . TUBAL LIGATION     Family History  Problem Relation Age of Onset  . Hypertension Mother   . Breast cancer Mother        Age 55  . Heart disease Father   . Alcohol abuse Father   . Breast cancer Sister        Age 57  . Cancer Maternal Grandfather        Colon cancer  . Heart disease Maternal Grandfather   . Diabetes Paternal Grandmother    Social History   Social History  . Marital status: Married    Spouse name: N/A  . Number of children: 2  . Years of education: N/A   Occupational History  .  [place] History Main Topics  . Smoking status: Never Smoker  . Smokeless tobacco: Never Used  . Alcohol use 0.0 oz/week     Comment: very rare  . Drug use: No  . Sexual activity: Yes    Birth control/ protection: Surgical     Comment: BTL-1st intercourse 24 yo-1 partner   Other Topics Concern  . None   Social History Narrative  . None    Outpatient Encounter Prescriptions as of 08/26/2016  Medication Sig  . Cholecalciferol  (VITAMIN D PO) Take by mouth.  . Multiple Vitamin (MULTIVITAMIN) tablet Take 1 tablet by mouth daily.     No facility-administered encounter medications on file as of 08/26/2016.     Review of Systems  Constitutional: Negative for appetite change and unexpected weight change.  HENT: Negative for congestion and sinus pressure.   Eyes: Negative for pain and visual disturbance.  Respiratory: Negative for cough, chest tightness and shortness of breath.   Cardiovascular: Negative for chest pain, palpitations and leg swelling.  Gastrointestinal: Negative for abdominal pain, diarrhea, nausea and vomiting.  Genitourinary: Negative for difficulty urinating and dysuria.  Musculoskeletal: Negative for joint swelling.       Back doing better s/p surgery.   Skin: Negative for color change and rash.  Neurological: Negative for dizziness, light-headedness and headaches.  Hematological: Negative for adenopathy. Does not bruise/bleed easily.  Psychiatric/Behavioral: Negative for agitation and dysphoric mood.       Objective:    Physical Exam  Constitutional: She appears well-developed and well-nourished. No distress.  HENT:  Nose: Nose normal.  Mouth/Throat: Oropharynx is clear and moist.  Eyes: Conjunctivae are normal. Right eye exhibits no discharge. Left eye exhibits no discharge.  Neck: Neck supple. No thyromegaly present.  Cardiovascular: Normal rate and regular rhythm.   Pulmonary/Chest: Breath sounds normal. No respiratory distress. She has no wheezes.  Abdominal: Soft. Bowel sounds are normal. There is no tenderness.  Musculoskeletal: She exhibits no edema or tenderness.  Lymphadenopathy:    She has no cervical adenopathy.  Skin: No rash noted. No erythema.  Psychiatric: She has a normal mood and affect. Her behavior is normal.    BP 110/78 (BP Location: Left Arm, Patient Position: Sitting, Cuff Size: Normal)   Pulse 67   Temp 97.6 F (36.4 C) (Oral)   Resp 12   Ht 5' 3.78" (1.62  m)   Wt 113 lb (51.3 kg)   SpO2 96%   BMI 19.53 kg/m  Wt Readings from Last 3 Encounters:  08/26/16 113 lb (51.3 kg)  06/10/16 115 lb (52.2 kg)  02/27/16 114 lb (51.7 kg)     Lab Results  Component Value Date   WBC 6.3 04/16/2016   HGB 12.9 04/16/2016   HCT 37.8 04/16/2016   PLT 221.0 04/16/2016   GLUCOSE 77 03/28/2016   CHOL 171 09/08/2015   TRIG 85.0 09/08/2015   HDL 64.80 09/08/2015   LDLCALC 90 09/08/2015   ALT 12 09/08/2015   AST 17 09/08/2015   NA 140 03/28/2016   K 3.5 03/28/2016   CL 103 03/28/2016   CREATININE 0.62 03/28/2016   BUN 20 03/28/2016   CO2 31 03/28/2016   TSH 1.04 09/08/2015   INR 1.2 (H) 03/28/2016       Assessment & Plan:   Problem List Items Addressed This Visit    Back pain    Back better s/p surgery.  Doing well.  Stays active.  Follow.        Health care maintenance    Physical today 08/26/16.  Colonoscopy 07/30/12 - internal hemorrhoids.  Mammogram 12/20/15 - benign.  Recommended f/u in one year.  Followed by oncology.  Sees gyn for pelvic and pap smears.        History of breast cancer    Followed by oncology.  They follow her mammograms.        Leukopenia    Follow cbc.  White count stable.            Einar Pheasant, MD

## 2016-08-26 NOTE — Progress Notes (Signed)
Pre-visit discussion using our clinic review tool. No additional management support is needed unless otherwise documented below in the visit note.  

## 2016-08-26 NOTE — Assessment & Plan Note (Signed)
Physical today 08/26/16.  Colonoscopy 07/30/12 - internal hemorrhoids.  Mammogram 12/20/15 - benign.  Recommended f/u in one year.  Followed by oncology.  Sees gyn for pelvic and pap smears.

## 2016-08-28 ENCOUNTER — Encounter: Payer: BLUE CROSS/BLUE SHIELD | Admitting: Gynecology

## 2016-09-07 ENCOUNTER — Encounter: Payer: Self-pay | Admitting: Internal Medicine

## 2016-09-07 NOTE — Assessment & Plan Note (Signed)
Follow cbc.  White count stable.

## 2016-09-07 NOTE — Assessment & Plan Note (Signed)
Followed by oncology.  They follow her mammograms.

## 2016-09-07 NOTE — Assessment & Plan Note (Signed)
Back better s/p surgery.  Doing well.  Stays active.  Follow.

## 2016-10-22 ENCOUNTER — Encounter: Payer: BLUE CROSS/BLUE SHIELD | Admitting: Gynecology

## 2016-10-24 ENCOUNTER — Encounter: Payer: BLUE CROSS/BLUE SHIELD | Admitting: Gynecology

## 2016-11-20 ENCOUNTER — Ambulatory Visit (INDEPENDENT_AMBULATORY_CARE_PROVIDER_SITE_OTHER): Payer: No Typology Code available for payment source | Admitting: Gynecology

## 2016-11-20 ENCOUNTER — Encounter: Payer: Self-pay | Admitting: Gynecology

## 2016-11-20 VITALS — BP 118/76 | Ht 64.0 in | Wt 115.0 lb

## 2016-11-20 DIAGNOSIS — N952 Postmenopausal atrophic vaginitis: Secondary | ICD-10-CM | POA: Diagnosis not present

## 2016-11-20 DIAGNOSIS — M8580 Other specified disorders of bone density and structure, unspecified site: Secondary | ICD-10-CM

## 2016-11-20 DIAGNOSIS — Z01411 Encounter for gynecological examination (general) (routine) with abnormal findings: Secondary | ICD-10-CM

## 2016-11-20 NOTE — Progress Notes (Signed)
    Tanya Fox 03-Apr-1951 [id number]        66 y.o.  G2P2002 for annual exam.   Past medical history,surgical history, problem list, medications, allergies, family history and social history were all reviewed and documented as reviewed in the EPIC chart.  ROS:  Performed with pertinent positives and negatives included in the history, assessment and plan.   Additional significant findings :    Exam: Caryn Bee assistant Vitals:   11/20/16 1431  BP: 118/76  Weight: 115 lb (52.2 kg)  Height: 5\' 4"  (1.626 m)   Body mass index is 19.74 kg/m.  General appearance:  Normal affect, orientation and appearance. Skin: Grossly normal HEENT: Without gross lesions.  No cervical or supraclavicular adenopathy. Thyroid normal.  Lungs:  Clear without wheezing, rales or rhonchi Cardiac: RR, without RMG Abdominal:  Soft, nontender, without masses, guarding, rebound, organomegaly or hernia Breasts:  Examined lying and sitting without masses, retractions, discharge or axillary adenopathy. Pelvic:  Ext, BUS, Vagina: With atrophic changes  Cervix: With atrophic changes  Uterus: Anteverted, normal size, shape and contour, midline and mobile nontender   Adnexa: Without masses or tenderness    Anus and perineum: Normal   Rectovaginal: Normal sphincter tone without palpated masses or tenderness.    Assessment/Plan:  66 y.o. G35P2002 female for annual exam.   1. Postmenopausal/atrophic genital changes. Doing well without significant hot flushes, night sweats, vaginal dryness or any vaginal bleeding. Continue to monitor report any issues or bleeding. 2. Mammography scheduled in September. Breast exam normal today. History of left-sided breast cancer stage I 2003. SBE monthly reviewed. 3. Osteopenia. DEXA 2014 T score -1.2 FRAX 6.6%/0.5%. Patient will schedule DEXA at [facility] when she does her mammography and she agrees to arrange. 4. Pap smear/HPV 2014. Pap smear done today. No history of abnormal Pap  smears previously. 5. Colonoscopy 2014. Repeat at their recommended interval. 6. Health maintenance. No routine lab work done as patient reports is done elsewhere. Follow up in one year, sooner as needed.   [name], 5:35 PM 11/20/2016

## 2016-11-20 NOTE — Patient Instructions (Signed)
Schedule your bone density when you schedule your mammogram.  Follow up in one year for annual exam

## 2016-11-21 NOTE — Addendum Note (Signed)
Addended by: Nelva Nay on: 11/21/2016 08:27 AM   Modules accepted: Orders

## 2016-11-25 LAB — PAP IG W/ RFLX HPV ASCU

## 2016-12-14 DIAGNOSIS — M810 Age-related osteoporosis without current pathological fracture: Secondary | ICD-10-CM

## 2016-12-14 HISTORY — DX: Age-related osteoporosis without current pathological fracture: M81.0

## 2016-12-16 ENCOUNTER — Ambulatory Visit
Admission: EM | Admit: 2016-12-16 | Discharge: 2016-12-16 | Disposition: A | Discharge status: Home / Self Care | Payer: No Typology Code available for payment source | Attending: Family Medicine | Admitting: Family Medicine

## 2016-12-16 DIAGNOSIS — R51 Headache: Secondary | ICD-10-CM

## 2016-12-16 DIAGNOSIS — N61 Mastitis without abscess: Secondary | ICD-10-CM

## 2016-12-16 DIAGNOSIS — J029 Acute pharyngitis, unspecified: Secondary | ICD-10-CM | POA: Diagnosis not present

## 2016-12-16 MED ORDER — SULFAMETHOXAZOLE-TRIMETHOPRIM 800-160 MG PO TABS: 1.0000 | ORAL_TABLET | Freq: Two times a day (BID) | ORAL | 0 refills | Status: DC

## 2016-12-16 NOTE — ED Provider Notes (Signed)
[facility]    CSN: [id number] Arrival date & time: 12/16/16  1306     History   Chief Complaint Chief Complaint  Patient presents with  . Breast Problem    HPI Tanya Fox is a 66 y.o. female.   66 yo female with a c/o headache and left breast skin redness, tenderness, and warmth. Denies any fevers, chills, drainage, mass/lump. Also noticed slight sore throat this morning.    The history is provided by the patient.    Past Medical History:  Diagnosis Date  . Atrophic vaginitis   . BRCA negative   . Breast cancer ([place]) 2003   Left side, Stage I  . Osteopenia 11/2012   T score -1.2 FRAX 6.6%/0.5%    Patient Active Problem List   Diagnosis Date Noted  . Viral conjunctivitis of left eye 06/10/2016  . Left hip pain 02/28/2016  . Pre-op evaluation 02/28/2016  . Health care maintenance 08/28/2015  . Groin pain 07/11/2015  . Back pain 03/06/2014  . Leukopenia 03/05/2012  . Osteopenia   . History of breast cancer     Past Surgical History:  Procedure Laterality Date  . BREAST SURGERY  2003-2004   Lumpectomy-Lymph nodes-Port a cath-Radiation and Chemo  . CESAREAN SECTION     X 2  . TOTAL HIP ARTHROPLASTY     Left  . TUBAL LIGATION      OB History    Gravida Para Term Preterm AB Living   _0 SAB TAB Ectopic Multiple Live Births                   Home Medications    Prior to Admission medications   Medication Sig Start Date End Date Taking? Authorizing Provider  Cholecalciferol (VITAMIN D PO) Take by mouth.    [provider]  Multiple Vitamin (MULTIVITAMIN) tablet Take 1 tablet by mouth daily.      [provider]  sulfamethoxazole-trimethoprim (BACTRIM DS,SEPTRA DS) 800-160 MG tablet Take 1 tablet by mouth 2 (two) times daily. 12/16/16   Norval Gable, MD    Family History Family History  Problem Relation Age of Onset  . Hypertension Mother   . Breast cancer Mother        Age 29  . Heart disease Father    . Alcohol abuse Father   . Breast cancer Sister        Age 57  . Cancer Maternal Grandfather        Colon cancer  . Heart disease Maternal Grandfather   . Diabetes Paternal Grandmother     Social History Social History  Substance Use Topics  . Smoking status: Never Smoker  . Smokeless tobacco: Never Used  . Alcohol use 0.0 oz/week     Comment: very rare     Allergies   Patient has no known allergies.   Review of Systems Review of Systems   Physical Exam Triage Vital Signs ED Triage Vitals  Enc Vitals Group     BP 12/16/16 1406 127/76     Pulse Rate 12/16/16 1406 75     Resp 12/16/16 1406 16     Temp 12/16/16 1406 98.2 F (36.8 C)     Temp Source 12/16/16 1406 Oral     SpO2 12/16/16 1406 98 %     Weight 12/16/16 1406 118 lb (53.5 kg)     Height 12/16/16 1406 _1  (1.626 m)  Head Circumference --      Peak Flow --      Pain Score 12/16/16 1408 5     Pain Loc --      Pain Edu? --      Excl. in [place]? --    No data found.   Updated Vital Signs BP 127/76 (BP Location: Right Arm)   Pulse 75   Temp 98.2 F (36.8 C) (Oral)   Resp 16   Ht _0  (1.626 m)   Wt 118 lb (53.5 kg)   SpO2 98%   BMI 20.25 kg/m   Visual Acuity Right Eye Distance:   Left Eye Distance:   Bilateral Distance:    Right Eye Near:   Left Eye Near:    Bilateral Near:     Physical Exam  Constitutional: She appears well-developed and well-nourished. No distress.  HENT:  Mouth/Throat: Uvula is midline, oropharynx is clear and moist and mucous membranes are normal.  Pulmonary/Chest:    Blanchable erythema, warmth and skin tenderness to the skin on the left breast  Skin: She is not diaphoretic.  Nursing note and vitals reviewed.    UC Treatments / Results  Labs (all labs ordered are listed, but only abnormal results are displayed) Labs Reviewed - No data to display  EKG  EKG Interpretation None       Radiology No results found.  Procedures Procedures (including  critical care time)  Medications Ordered in UC Medications - No data to display   Initial Impression / Assessment and Plan / UC Course  I have reviewed the triage vital signs and the nursing notes.  Pertinent labs & imaging results that were available during my care of the patient were reviewed by me and considered in my medical decision making (see chart for details).       Final Clinical Impressions(s) / UC Diagnoses   Final diagnoses:  Cellulitis of left breast    New Prescriptions Discharge Medication List as of 12/16/2016  3:06 PM    START taking these medications   Details  sulfamethoxazole-trimethoprim (BACTRIM DS,SEPTRA DS) 800-160 MG tablet Take 1 tablet by mouth 2 (two) times daily., Starting Mon 12/16/2016, Normal       1. diagnosis reviewed with patient 2. rx as per orders above; reviewed possible side effects, interactions, risks and benefits  3. Recommend supportive treatment with warm compresses to area 4. Follow-up prn if symptoms worsen or don't improve  Controlled Substance Prescriptions [place] Controlled Substance Registry consulted? Not Applicable   Norval Gable, MD 12/16/16 [phone]

## 2016-12-16 NOTE — ED Triage Notes (Signed)
Pt reports radiation, lumpectomy and chemo to left breast 13 yrs ago. Today awoke with headache and sore throat and left breast swollen, warm, red, and itchy. Pain 5/10

## 2016-12-18 ENCOUNTER — Telehealth: Payer: Self-pay | Admitting: *Deleted

## 2016-12-18 NOTE — Telephone Encounter (Signed)
Patient stated that we can not see her the rest of the week. There are no open appointments. Advised patient to go back to walk in and she stated that she could not do so today but would try in the morning.

## 2016-12-18 NOTE — Telephone Encounter (Signed)
Pt has Hx of Breast Cancer, she was seen at [facility] walk in on 09/03 for cellulitis in her left breast. Pt was given sulsmehoxacole- trimethoprim and advised to mark the area and use warm compresses. The area has not gotten bigger, yet pt has not seen results. Pease advise  Pt contact [phone]

## 2016-12-18 NOTE — Telephone Encounter (Signed)
Patient was seen in [place] in on 12/16/2016 for cellulitis of left breast and was given Bactrim DS BID x10 days. She has taken 5 doses of the medication and has not seen any improvement. She states that she marked the area to monitor the redness and it has not gotten worse but has not gotten better either. She was wondering if the medication isn't healing the cellulitis or if she has not taken enough of it yet. Denies any pain or acute symptoms. Please advise.

## 2016-12-18 NOTE — Telephone Encounter (Signed)
Please inform pt that I am not in the office this pm.  Given she is on abx and given persistent redness with no improvement, she needs to be seen today.

## 2016-12-19 NOTE — Telephone Encounter (Signed)
Please call and confirm pt doing ok and if infection better.  See previous phone message.

## 2016-12-19 NOTE — Telephone Encounter (Signed)
Patient said she will return to the walk in clinic and if that does not help she would like to see PCP .

## 2016-12-20 ENCOUNTER — Encounter: Payer: Self-pay | Admitting: Gynecology

## 2016-12-31 ENCOUNTER — Encounter: Payer: Self-pay | Admitting: Gynecology

## 2016-12-31 ENCOUNTER — Telehealth: Payer: Self-pay | Admitting: Gynecology

## 2016-12-31 ENCOUNTER — Encounter: Payer: Self-pay | Admitting: *Deleted

## 2016-12-31 DIAGNOSIS — M818 Other osteoporosis without current pathological fracture: Secondary | ICD-10-CM

## 2016-12-31 NOTE — Telephone Encounter (Signed)
Order placed sent pt my chart message to let me know when to schedule lab appointment

## 2016-12-31 NOTE — Telephone Encounter (Signed)
Tell patient her most recent bone density showed osteoporosis. Recommend checking a vitamin D level, comprehensive metabolic panel, TSH, PTH and office visit afterwards to discuss these results and her bone density.

## 2017-01-06 NOTE — Telephone Encounter (Signed)
Lab appointment on 01/16/17

## 2017-01-16 ENCOUNTER — Other Ambulatory Visit: Payer: No Typology Code available for payment source

## 2017-01-16 DIAGNOSIS — M818 Other osteoporosis without current pathological fracture: Secondary | ICD-10-CM

## 2017-01-17 LAB — COMPREHENSIVE METABOLIC PANEL
AG Ratio: 1.7 (calc) (ref 1.0–2.5)
ALBUMIN MSPROF: 4.2 g/dL (ref 3.6–5.1)
ALT: 11 U/L (ref 6–29)
AST: 20 U/L (ref 10–35)
Alkaline phosphatase (APISO): 87 U/L (ref 33–130)
BUN: 11 mg/dL (ref 7–25)
CO2: 28 mmol/L (ref 20–32)
CREATININE: 0.66 mg/dL (ref 0.50–0.99)
Calcium: 9.2 mg/dL (ref 8.6–10.4)
Chloride: 106 mmol/L (ref 98–110)
GLUCOSE: 93 mg/dL (ref 65–99)
Globulin: 2.5 g/dL (calc) (ref 1.9–3.7)
POTASSIUM: 4 mmol/L (ref 3.5–5.3)
SODIUM: 140 mmol/L (ref 135–146)
TOTAL PROTEIN: 6.7 g/dL (ref 6.1–8.1)
Total Bilirubin: 0.4 mg/dL (ref 0.2–1.2)

## 2017-01-17 LAB — PARATHYROID HORMONE, INTACT (NO CA): PTH: 36 pg/mL (ref 14–64)

## 2017-01-17 LAB — TSH: TSH: 1.18 m[IU]/L (ref 0.40–4.50)

## 2017-01-17 LAB — VITAMIN D 25 HYDROXY (VIT D DEFICIENCY, FRACTURES): Vit D, 25-Hydroxy: 38 ng/mL (ref 30–100)

## 2017-01-23 ENCOUNTER — Encounter: Payer: Self-pay | Admitting: Gynecology

## 2017-01-23 ENCOUNTER — Ambulatory Visit (INDEPENDENT_AMBULATORY_CARE_PROVIDER_SITE_OTHER): Payer: No Typology Code available for payment source | Admitting: Gynecology

## 2017-01-23 VITALS — BP 118/76

## 2017-01-23 DIAGNOSIS — M810 Age-related osteoporosis without current pathological fracture: Secondary | ICD-10-CM | POA: Diagnosis not present

## 2017-01-23 NOTE — Progress Notes (Signed)
    Tanya Fox Nov 13, 1950 [id number]        66 y.o.  G2P2002 presents to discuss her most recent bone density at [facility]. T score was -2.9 at the distal third radius which was the first time this was measured. Right femoral neck -2.3 with a 17% decline from her prior study 2014 and a -1.7 at the AP spine decreased 13% from her prior 2014 study. Lab work showed a normal TSH, PTH, calcium with a vitamin D level 38.  Past medical history,surgical history, problem list, medications, allergies, family history and social history were all reviewed and documented in the EPIC chart.  Directed ROS with pertinent positives and negatives documented in the history of present illness/assessment and plan.  Exam: Vitals:   01/23/17 1448  BP: 118/76   General appearance:  Normal   Assessment/Plan:  66 y.o. 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 Significant decline at right femoral neck an AP spine. We reviewed her DEXA report in detail. Increased fracture risk with osteoporosis/osteopenia also discussed. I also reviewed with her at [facility] and switched machines which is making comparison somewhat difficult at this time. She is active physically. Does have a history of Arimidex previously for her breast cancer but has been off of this for approximately 5 years. I reviewed treatment options to include bisphosphonates such as Fosamax, Actonel, Reclast. We discussed Prolia and Evista. Risks and side effects were reviewed to include GERD, osteonecrosis of the jaw, atypical fractures, rashes, infections, thrombosis risk with Evista. After a lengthy discussion at this point the patient prefers not to be treated with medication but to supplement her vitamin D with 1000 units daily and plan to repeat the bone density in 2 years and then go from there. She understands at that point she may have had ongoing loss it would have to accept this versus the possibility of a stable finding and again not  requiring medication. Patient will call me if she has any further questions or changes her mind as far as treatment. Otherwise she will follow up with me when she is due for annual exam in one year.  Greater than 50% of my time was spent in direct face to face counseling and coordination of care with the patient.   [name], 3:18 PM 01/23/2017

## 2017-01-23 NOTE — Patient Instructions (Signed)
Follow up for your annual exam next year when due.

## 2017-04-14 ENCOUNTER — Ambulatory Visit: Payer: Self-pay | Admitting: *Deleted

## 2017-04-14 NOTE — Telephone Encounter (Signed)
Called in c/o burning with urination for 2 days now.   The office closed today at 12 noon for the holiday (New Year's) and is closed tomorrow.    I made some suggestions for some OTC medications she could try for the burning.   She is at the pharmacy now.   I suggested she check with the pharmacist for suggestions also. I instructed her to go to an urgent care if she felt she needed to be seen sooner than Wednesday.   She verbalized understanding and that she would call us back on Wednesday morning if she is not better or did not go to the urgent care.  Reason for Disposition . Age > 50 years  Answer Assessment - Initial Assessment Questions 1. SEVERITY: "How bad is the pain?"  (e.g., Scale 1-10; mild, moderate, or severe)   - MILD (1-3): complains slightly about urination hurting   - MODERATE (4-7): interferes with normal activities     - SEVERE (8-10): excruciating, unwilling or unable to urinate because of the pain      Burns with urination 2. FREQUENCY: "How many times have you had painful urination today?"      Every time I've urinated. 3. PATTERN: "Is pain present every time you urinate or just sometimes?"      *No Answer* 4. ONSET: "When did the painful urination start?"      2 days ago 5. FEVER: "Do you have a fever?" If so, ask: "What is your temperature, how was it measured, and when did it start?"     No 6. PAST UTI: "Have you had a urine infection before?" If so, ask: "When was the last time?" and "What happened that time?"      Oh yes.   Many times 7. CAUSE: "What do you think is causing the painful urination?"  (e.g., UTI, scratch, Herpes sore)     UTI 8. OTHER SYMPTOMS: "Do you have any other symptoms?" (e.g., flank pain, vaginal discharge, genital sores, urgency, blood in urine)     No 9. PREGNANCY: "Is there any chance you are pregnant?" "When was your last menstrual period?"     Not asked due to age  Protocols used: [place]

## 2017-04-17 ENCOUNTER — Telehealth: Payer: Self-pay | Admitting: Internal Medicine

## 2017-04-17 NOTE — Telephone Encounter (Unsigned)
Copied from [place] [id number]. Topic: Quick Communication - See Telephone Encounter >> Apr 17, 2017  2:59 PM Percell Belt A wrote: CRM for notification. See Telephone encounter for: pt called back in today and said that she tried all the OTC med for her uti but it is not help.  She said that she is not any better and would like to know if she can just come by and leave a urine sample?  Or does she need to be seen?    04/17/17.

## 2017-04-17 NOTE — Telephone Encounter (Signed)
Advised patient that she would need to be seen instead of just dropping off a urine. Patient became upset when I told her that we did not have any availability. Patient stated that she can never get in to be seen when she needs to be so she will find a new doctor.

## 2017-04-17 NOTE — Telephone Encounter (Signed)
This is the pt I discussed with you.  Was not aware she had called to be seen.  I can see her for acute problem tomorrow.  (have her come in at 1:00).  If she wants to change MD can do so, but I will be happy to see her for her acute issue.

## 2017-04-17 NOTE — Telephone Encounter (Signed)
Left message for patient to return cal to office will continue to monitor.

## 2017-04-18 ENCOUNTER — Encounter: Payer: Self-pay | Admitting: Gynecology

## 2017-04-18 ENCOUNTER — Ambulatory Visit (INDEPENDENT_AMBULATORY_CARE_PROVIDER_SITE_OTHER): Payer: No Typology Code available for payment source | Admitting: Gynecology

## 2017-04-18 VITALS — BP 120/74

## 2017-04-18 DIAGNOSIS — N3000 Acute cystitis without hematuria: Secondary | ICD-10-CM | POA: Diagnosis not present

## 2017-04-18 MED ORDER — SULFAMETHOXAZOLE-TRIMETHOPRIM 800-160 MG PO TABS: 1.0000 | ORAL_TABLET | Freq: Two times a day (BID) | ORAL | 0 refills | Status: DC

## 2017-04-18 NOTE — Addendum Note (Signed)
Addended by: Nelva Nay on: 04/18/2017 02:58 PM   Modules accepted: Orders

## 2017-04-18 NOTE — Progress Notes (Signed)
    Tanya Fox 05-06-1950 [id number]        66 y.o.  G2P2002 presents with 3-4 days of urinary frequency, urgency and dysuria.  Initially took AZO which made her feel better but then stopped yesterday and had a return of her symptoms.  No low back pain fever or chills.  No vaginal symptoms such as vaginal discharge irritation or odor.  No nausea, vomiting, diarrhea, constipation.  Past medical history,surgical history, problem list, medications, allergies, family history and social history were all reviewed and documented in the EPIC chart.  Directed ROS with pertinent positives and negatives documented in the history of present illness/assessment and plan.  Exam: Vitals:   04/18/17 1413  BP: 120/74   General appearance:  Normal Fine straight without CVA tenderness Abdomen soft nontender without masses guarding rebound  Assessment/Plan:  67 y.o. P1G9842 with history and urine analysis classic for UTI.  Septra DS 1 p.o. twice daily times 3 days.  Continue AZO for the next day or 2 to help with symptoms.  Follow-up if symptoms persist, worsen or recur.    [name], 2:29 PM 04/18/2017

## 2017-04-18 NOTE — Telephone Encounter (Signed)
I still cannot reach patient I have left a third message. FYI

## 2017-04-18 NOTE — Patient Instructions (Signed)
Take the antibiotic twice daily for 3 days. 

## 2017-04-20 LAB — URINALYSIS W MICROSCOPIC + REFLEX CULTURE
Bilirubin Urine: NEGATIVE
Glucose, UA: NEGATIVE
HYALINE CAST: NONE SEEN /LPF
KETONES UR: NEGATIVE
Nitrites, Initial: NEGATIVE
Protein, ur: NEGATIVE
RBC / HPF: NONE SEEN /HPF (ref 0–2)
SPECIFIC GRAVITY, URINE: 1.004 (ref 1.001–1.03)
pH: 6 (ref 5.0–8.0)

## 2017-04-20 LAB — URINE CULTURE
MICRO NUMBER:: 90019950
SPECIMEN QUALITY: ADEQUATE

## 2017-04-20 LAB — CULTURE INDICATED

## 2017-04-21 ENCOUNTER — Telehealth: Payer: Self-pay | Admitting: *Deleted

## 2017-04-21 MED ORDER — CIPROFLOXACIN HCL 250 MG PO TABS: 250.0000 mg | ORAL_TABLET | Freq: Two times a day (BID) | ORAL | 0 refills | Status: DC

## 2017-04-21 NOTE — Telephone Encounter (Signed)
Pt aware, Rx sent. 

## 2017-04-21 NOTE — Telephone Encounter (Signed)
Pt was treated with Septra DS 3 day on 04/18/17 ,states still has urine frequency only. Please advise

## 2017-04-21 NOTE — Telephone Encounter (Signed)
Switch to ciprofloxacin 250 mg twice daily times 3 days.

## 2017-06-12 ENCOUNTER — Encounter: Payer: Self-pay | Admitting: Internal Medicine

## 2017-06-12 ENCOUNTER — Telehealth: Payer: Self-pay

## 2017-06-12 NOTE — Telephone Encounter (Signed)
LMTCB

## 2017-06-12 NOTE — Telephone Encounter (Signed)
Copied from [place]. Topic: Inquiry >> Jun 12, 2017  9:08 AM Cecelia Byars, NT wrote: Reason for CRM: Patient has questions concerning medicare please advise

## 2017-06-13 NOTE — Telephone Encounter (Signed)
I can see her and we can discuss further treatment.  I can see her Tuesday 06/17/17.  Do you mind calling her and scheduling.  Thanks.    Dr Nicki Reaper

## 2017-06-17 ENCOUNTER — Encounter: Payer: Self-pay | Admitting: Internal Medicine

## 2017-06-17 ENCOUNTER — Ambulatory Visit (INDEPENDENT_AMBULATORY_CARE_PROVIDER_SITE_OTHER): Payer: No Typology Code available for payment source | Admitting: Internal Medicine

## 2017-06-17 DIAGNOSIS — G479 Sleep disorder, unspecified: Secondary | ICD-10-CM | POA: Diagnosis not present

## 2017-06-17 MED ORDER — TRAZODONE HCL 50 MG PO TABS: 25.0000 mg | ORAL_TABLET | Freq: Every evening | ORAL | 1 refills | Status: DC | PRN

## 2017-06-17 NOTE — Progress Notes (Signed)
Patient ID: Tanya Fox, female   DOB: 12/05/1950, 66 y.o.   MRN: [id number]   Subjective:    Patient ID: Tanya Fox, female    DOB: 10/08/1950, 66 y.o.   MRN: [id number]  HPI  Patient here as a work in with sleeping difficulty.  Over the last 3-4 months, she has had problems.  States some nights has problems falling asleep and some nights she has problems staying asleep.  No depression.  No increased stress.  No chest pain.  No sob.  Is exercising regularly.     Past Medical History:  Diagnosis Date  . Atrophic vaginitis   . BRCA negative   . Breast cancer (HCC) 2003   Left side, Stage I  . Osteopenia 11/2012   T score -1.2 FRAX 6.6%/0.5%  . Osteoporosis 12/2016   T score -2.9 distal third of the radius   Past Surgical History:  Procedure Laterality Date  . BREAST SURGERY  2003-2004   Lumpectomy-Lymph nodes-Port a cath-Radiation and Chemo  . CESAREAN SECTION     X 2  . TOTAL HIP ARTHROPLASTY     Left  . TUBAL LIGATION     Family History  Problem Relation Age of Onset  . Hypertension Mother   . Breast cancer Mother        Age 49  . Heart disease Father   . Alcohol abuse Father   . Breast cancer Sister        Age 49  . Cancer Maternal Grandfather        Colon cancer  . Heart disease Maternal Grandfather   . Diabetes Paternal Grandmother    Social History   Socioeconomic History  . Marital status: Married    Spouse name: None  . Number of children: 2  . Years of education: None  . Highest education level: None  Social Needs  . Financial resource strain: None  . Food insecurity - worry: None  . Food insecurity - inability: None  . Transportation needs - medical: None  . Transportation needs - non-medical: None  Occupational History    Employer: [employer]  Tobacco Use  . Smoking status: Never Smoker  . Smokeless tobacco: Never Used  Substance and Sexual Activity  . Alcohol use: Yes    Alcohol/week: 0.0 oz    Comment: very rare  . Drug  use: No  . Sexual activity: Yes    Birth control/protection: Surgical    Comment: BTL-1st intercourse 22 yo-1 partner  Other Topics Concern  . None  Social History Narrative  . None    Outpatient Encounter Medications as of 06/17/2017  Medication Sig  . Cholecalciferol (VITAMIN D PO) Take by mouth.  . Multiple Vitamin (MULTIVITAMIN) tablet Take 1 tablet by mouth daily.    . traZODone (DESYREL) 50 MG tablet Take 0.5-1 tablets (25-50 mg total) by mouth at bedtime as needed for sleep.  . [DISCONTINUED] ciprofloxacin (CIPRO) 250 MG tablet Take 1 tablet (250 mg total) by mouth 2 (two) times daily.   No facility-administered encounter medications on file as of 06/17/2017.     Review of Systems  Constitutional: Negative for appetite change and unexpected weight change.  Respiratory: Negative for chest tightness and shortness of breath.   Cardiovascular: Negative for chest pain, palpitations and leg swelling.  Gastrointestinal: Negative for abdominal pain, diarrhea, nausea and vomiting.  Musculoskeletal: Negative for joint swelling and myalgias.  Neurological: Negative for dizziness, light-headedness and headaches.  Psychiatric/Behavioral: Negative for agitation and   dysphoric mood.       Objective:    Physical Exam  Constitutional: She appears well-developed and well-nourished. No distress.  HENT:  Nose: Nose normal.  Mouth/Throat: Oropharynx is clear and moist.  Neck: Neck supple. No thyromegaly present.  Cardiovascular: Normal rate and regular rhythm.  Pulmonary/Chest: Breath sounds normal. No respiratory distress. She has no wheezes.  Abdominal: Soft. Bowel sounds are normal. There is no tenderness.  Musculoskeletal: She exhibits no edema or tenderness.  Lymphadenopathy:    She has no cervical adenopathy.  Skin: No rash noted. No erythema.  Psychiatric: She has a normal mood and affect. Her behavior is normal.    BP 130/72 (BP Location: Left Arm, Patient Position: Sitting,  Cuff Size: Normal)   Pulse 85   Temp 98.3 F (36.8 C) (Oral)   Resp 18   Wt 121 lb 3.2 oz (55 kg)   SpO2 97%   BMI 20.80 kg/m  Wt Readings from Last 3 Encounters:  06/17/17 121 lb 3.2 oz (55 kg)  12/16/16 118 lb (53.5 kg)  11/20/16 115 lb (52.2 kg)     Lab Results  Component Value Date   WBC 6.3 04/16/2016   HGB 12.9 04/16/2016   HCT 37.8 04/16/2016   PLT 221.0 04/16/2016   GLUCOSE 93 01/16/2017   CHOL 171 09/08/2015   TRIG 85.0 09/08/2015   HDL 64.80 09/08/2015   LDLCALC 90 09/08/2015   ALT 11 01/16/2017   AST 20 01/16/2017   NA 140 01/16/2017   K 4.0 01/16/2017   CL 106 01/16/2017   CREATININE 0.66 01/16/2017   BUN 11 01/16/2017   CO2 28 01/16/2017   TSH 1.18 01/16/2017   INR 1.2 (H) 03/28/2016       Assessment & Plan:   Problem List Items Addressed This Visit    Sleeping difficulties    Discussed with her today.  She has tried otc medication and melatonin.  No help.  Ongoing problem.  Feels needs something to break cycle.  Discussed treatment options.  Will try trazodone as directed.  Follow.  Call with update.            Einar Pheasant, MD

## 2017-06-21 ENCOUNTER — Encounter: Payer: Self-pay | Admitting: Internal Medicine

## 2017-06-21 DIAGNOSIS — G479 Sleep disorder, unspecified: Secondary | ICD-10-CM | POA: Insufficient documentation

## 2017-06-21 NOTE — Assessment & Plan Note (Signed)
Discussed with her today.  She has tried otc medication and melatonin.  No help.  Ongoing problem.  Feels needs something to break cycle.  Discussed treatment options.  Will try trazodone as directed.  Follow.  Call with update.

## 2017-07-14 ENCOUNTER — Encounter: Payer: Self-pay | Admitting: Internal Medicine

## 2017-07-16 ENCOUNTER — Encounter: Payer: Self-pay | Admitting: Internal Medicine

## 2017-07-16 ENCOUNTER — Telehealth: Payer: Self-pay | Admitting: Internal Medicine

## 2017-07-16 MED ORDER — HYDROXYZINE HCL 10 MG PO TABS: 10.0000 mg | ORAL_TABLET | Freq: Every evening | ORAL | 0 refills | Status: DC | PRN

## 2017-07-16 NOTE — Telephone Encounter (Signed)
rx sent in for hydroxyzine 10mg  #30 with no refills.

## 2017-07-16 NOTE — Telephone Encounter (Signed)
I sent a my chart message back to her regarding this.  If she calls you can see my message.

## 2017-07-16 NOTE — Telephone Encounter (Signed)
Copied from [place] [id number]. Topic: Quick Communication - See Telephone Encounter >> Jul 16, 2017  8:52 AM Bea Graff, NT wrote: CRM for notification. See Telephone encounter for: 07/16/17. Pt states that the traZODone [facility]) has been making her feel like she just can't get going in the morning and feels "lethargic". She states it takes her to lunch time to feel like she can get going. She wants to try something else in place of the Trazodone. Uses [facility].

## 2017-07-16 NOTE — Telephone Encounter (Signed)
Looks like patient has already sent you a MyChart message regarding this.

## 2017-07-16 NOTE — Telephone Encounter (Signed)
Please advise 

## 2017-07-17 NOTE — Telephone Encounter (Signed)
Patient responded via My chart,.

## 2017-09-01 ENCOUNTER — Encounter: Payer: No Typology Code available for payment source | Admitting: Internal Medicine

## 2017-09-02 ENCOUNTER — Other Ambulatory Visit: Payer: Self-pay | Admitting: Internal Medicine

## 2017-09-02 ENCOUNTER — Encounter: Payer: Self-pay | Admitting: Internal Medicine

## 2017-09-03 ENCOUNTER — Telehealth: Payer: Self-pay | Admitting: *Deleted

## 2017-09-03 MED ORDER — HYDROXYZINE HCL 10 MG PO TABS: 10.0000 mg | ORAL_TABLET | Freq: Every evening | ORAL | 0 refills | Status: DC | PRN

## 2017-09-03 NOTE — Telephone Encounter (Signed)
(  patient aware you are out of the office) Patient going to [place] for 3 weeks on 09/22/17, she asked if you would be willing to prescribed UTI medication to have on hand, had UTI in Jan, as she will be in swim wear a lot.  Please advise

## 2017-09-04 MED ORDER — HYDROXYZINE HCL 10 MG PO TABS: ORAL_TABLET | ORAL | 0 refills | Status: DC

## 2017-09-04 MED ORDER — CIPROFLOXACIN HCL 250 MG PO TABS: 250.0000 mg | ORAL_TABLET | Freq: Two times a day (BID) | ORAL | 0 refills | Status: DC

## 2017-09-04 NOTE — Telephone Encounter (Signed)
Ok to increase dose and send in for her?

## 2017-09-04 NOTE — Telephone Encounter (Signed)
Pt informed. Rx

## 2017-09-04 NOTE — Telephone Encounter (Signed)
rx sent for hydroxyzine #60 with no refills.

## 2017-09-04 NOTE — Telephone Encounter (Signed)
Okay for ciprofloxacin 250 mg twice daily x7 days

## 2017-10-14 ENCOUNTER — Ambulatory Visit (INDEPENDENT_AMBULATORY_CARE_PROVIDER_SITE_OTHER): Payer: Medicare HMO | Admitting: Internal Medicine

## 2017-10-14 ENCOUNTER — Encounter: Payer: Self-pay | Admitting: Internal Medicine

## 2017-10-14 VITALS — BP 118/68 | HR 68 | Temp 97.8°F | Resp 18 | Wt 116.8 lb

## 2017-10-14 DIAGNOSIS — D72819 Decreased white blood cell count, unspecified: Secondary | ICD-10-CM

## 2017-10-14 DIAGNOSIS — M8580 Other specified disorders of bone density and structure, unspecified site: Secondary | ICD-10-CM

## 2017-10-14 DIAGNOSIS — Z0000 Encounter for general adult medical examination without abnormal findings: Secondary | ICD-10-CM

## 2017-10-14 DIAGNOSIS — R3915 Urgency of urination: Secondary | ICD-10-CM | POA: Diagnosis not present

## 2017-10-14 DIAGNOSIS — Z853 Personal history of malignant neoplasm of breast: Secondary | ICD-10-CM | POA: Diagnosis not present

## 2017-10-14 DIAGNOSIS — Z13220 Encounter for screening for lipoid disorders: Secondary | ICD-10-CM | POA: Diagnosis not present

## 2017-10-14 LAB — URINALYSIS, ROUTINE W REFLEX MICROSCOPIC
Bilirubin Urine: NEGATIVE
HGB URINE DIPSTICK: NEGATIVE
Ketones, ur: NEGATIVE
Leukocytes, UA: NEGATIVE
Nitrite: NEGATIVE
RBC / HPF: NONE SEEN (ref 0–?)
SPECIFIC GRAVITY, URINE: 1.01 (ref 1.000–1.030)
TOTAL PROTEIN, URINE-UPE24: NEGATIVE
Urine Glucose: NEGATIVE
Urobilinogen, UA: 0.2 (ref 0.0–1.0)
WBC, UA: NONE SEEN (ref 0–?)
pH: 6 (ref 5.0–8.0)

## 2017-10-14 MED ORDER — HYDROXYZINE HCL 10 MG PO TABS: ORAL_TABLET | ORAL | 0 refills | Status: DC
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
## 2017-10-15 ENCOUNTER — Encounter: Payer: Self-pay | Admitting: Internal Medicine

## 2017-10-15 LAB — URINE CULTURE
MICRO NUMBER: 90787073
RESULT: NO GROWTH
SPECIMEN QUALITY: ADEQUATE

## 2017-10-17 ENCOUNTER — Encounter: Payer: Self-pay | Admitting: Internal Medicine

## 2017-10-19 ENCOUNTER — Encounter: Payer: Self-pay | Admitting: Internal Medicine

## 2017-10-19 NOTE — Assessment & Plan Note (Signed)
Gyn orders her mammogram.  Up to date.

## 2017-10-19 NOTE — Assessment & Plan Note (Signed)
Follow cbc.  

## 2017-10-19 NOTE — Assessment & Plan Note (Signed)
Continue vitamin D.  Dietary calcium.  Weight bearing exercise.

## 2017-10-20 DIAGNOSIS — R69 Illness, unspecified: Secondary | ICD-10-CM | POA: Diagnosis not present

## 2017-10-21 DIAGNOSIS — M25552 Pain in left hip: Secondary | ICD-10-CM | POA: Diagnosis not present

## 2017-10-21 DIAGNOSIS — Z96642 Presence of left artificial hip joint: Secondary | ICD-10-CM | POA: Diagnosis not present

## 2017-10-21 DIAGNOSIS — Z09 Encounter for follow-up examination after completed treatment for conditions other than malignant neoplasm: Secondary | ICD-10-CM | POA: Diagnosis not present

## 2017-10-21 DIAGNOSIS — S42001A Fracture of unspecified part of right clavicle, initial encounter for closed fracture: Secondary | ICD-10-CM | POA: Diagnosis not present

## 2017-11-04 ENCOUNTER — Encounter: Payer: No Typology Code available for payment source | Admitting: Internal Medicine

## 2017-11-21 ENCOUNTER — Encounter: Payer: No Typology Code available for payment source | Admitting: Gynecology

## 2017-11-25 DIAGNOSIS — S42001D Fracture of unspecified part of right clavicle, subsequent encounter for fracture with routine healing: Secondary | ICD-10-CM | POA: Diagnosis not present

## 2017-12-02 ENCOUNTER — Encounter: Payer: No Typology Code available for payment source | Admitting: Gynecology

## 2017-12-04 ENCOUNTER — Telehealth: Payer: Self-pay | Admitting: Internal Medicine

## 2017-12-04 MED ORDER — HYDROXYZINE HCL 10 MG PO TABS: ORAL_TABLET | ORAL | 0 refills | Status: DC

## 2017-12-11 DIAGNOSIS — H04221 Epiphora due to insufficient drainage, right lacrimal gland: Secondary | ICD-10-CM | POA: Diagnosis not present

## 2017-12-18 DIAGNOSIS — H04123 Dry eye syndrome of bilateral lacrimal glands: Secondary | ICD-10-CM | POA: Diagnosis not present

## 2017-12-18 NOTE — Telephone Encounter (Signed)
Pt came in stating that she received a letter about needing a PA for the next refill for medication for      hydrOXYzine (ATARAX/VISTARIL) 10 MG tablet   Letter is in color folder up front.   Please call at @ [phone]. Thank you!

## 2017-12-19 NOTE — Telephone Encounter (Signed)
Received paper and called pt. Will call [facility] to see what is needed.

## 2017-12-22 DIAGNOSIS — Z1231 Encounter for screening mammogram for malignant neoplasm of breast: Secondary | ICD-10-CM | POA: Diagnosis not present

## 2017-12-22 DIAGNOSIS — Z853 Personal history of malignant neoplasm of breast: Secondary | ICD-10-CM | POA: Diagnosis not present

## 2017-12-23 DIAGNOSIS — M67911 Unspecified disorder of synovium and tendon, right shoulder: Secondary | ICD-10-CM | POA: Diagnosis not present

## 2017-12-24 ENCOUNTER — Telehealth: Payer: Self-pay | Admitting: Internal Medicine

## 2017-12-24 NOTE — Telephone Encounter (Signed)
Please advise 

## 2017-12-24 NOTE — Telephone Encounter (Signed)
Copied from [place] [id number]. Topic: Quick Communication - See Telephone Encounter >> Dec 24, 2017 10:38 AM Ahmed Prima L wrote: CRM for notification. See Telephone encounter for: 12/24/17.  Patient thinks she has a pulled muscle around her buttocks 5 inches down from her waist. It only hurts sometimes but wants to know could Dr Nicki Reaper refill her meloxicam (MOBIC) 7.5 MG tablet [[id number]. Please advise.  [facility]

## 2017-12-24 NOTE — Telephone Encounter (Signed)
Patient is aware that you are out of the office until tomorrow and stated it would be okay to wait until you return

## 2017-12-25 MED ORDER — MELOXICAM 7.5 MG PO TABS: 7.5000 mg | ORAL_TABLET | Freq: Every day | ORAL | 0 refills | Status: DC | PRN

## 2017-12-25 NOTE — Telephone Encounter (Signed)
I have sent in rx for mobic #30 with no refills.  Confirm she had not problems taking in the past. Monitor for any GI upset, etc.  Let us know if her back continues to be an issue.  If persistent problems, will need to be evaluated.

## 2017-12-25 NOTE — Telephone Encounter (Signed)
Patient aware and has taken before. She will call if symptoms persist.

## 2017-12-29 ENCOUNTER — Encounter: Payer: Self-pay | Admitting: Podiatry

## 2017-12-29 ENCOUNTER — Ambulatory Visit: Payer: Medicare HMO | Admitting: Podiatry

## 2017-12-29 DIAGNOSIS — L603 Nail dystrophy: Secondary | ICD-10-CM | POA: Diagnosis not present

## 2017-12-29 DIAGNOSIS — L608 Other nail disorders: Secondary | ICD-10-CM | POA: Diagnosis not present

## 2017-12-29 NOTE — Progress Notes (Signed)
  Subjective:  Patient ID: Tanya Fox, female    DOB: 1950-09-23,  MRN: [id number] HPI Chief Complaint  Patient presents with  . Nail Problem    Toenails - thick and discolored nails x years, tried OTC meds x 2 months-no help, used to be an active runner  . New Patient (Initial Visit)    67 y.o. female presents with the above complaint.   ROS: She denies fever chills nausea vomiting muscle aches pains calf pain back pain chest pain shortness of breath.  Past Medical History:  Diagnosis Date  . Atrophic vaginitis   . BRCA negative   . Breast cancer ([place]) 2003   Left side, Stage I  . Osteopenia 11/2012   T score -1.2 FRAX 6.6%/0.5%  . Osteoporosis 12/2016   T score -2.9 distal third of the radius   Past Surgical History:  Procedure Laterality Date  . BREAST SURGERY  2003-2004   Lumpectomy-Lymph nodes-Port a cath-Radiation and Chemo  . CESAREAN SECTION     X 2  . TOTAL HIP ARTHROPLASTY     Left  . TUBAL LIGATION      Current Outpatient Medications:  .  Cholecalciferol (VITAMIN D PO), Take by mouth., Disp: , Rfl:  .  cyclobenzaprine (FLEXERIL) 10 MG tablet, cyclobenzaprine 10 mg tablet, Disp: , Rfl:  .  diclofenac (VOLTAREN) 75 MG EC tablet, diclofenac sodium 75 mg tablet,delayed release, Disp: , Rfl:  .  hydrOXYzine (ATARAX/VISTARIL) 10 MG tablet, Take 1-2 tablets q hs prn, Disp: 60 tablet, Rfl: 0 .  meloxicam (MOBIC) 7.5 MG tablet, Take 1 tablet (7.5 mg total) by mouth daily as needed for pain., Disp: 30 tablet, Rfl: 0 .  Multiple Vitamin (MULTIVITAMIN) tablet, Take 1 tablet by mouth daily.  , Disp: , Rfl:  .  traMADol (ULTRAM) 50 MG tablet, tramadol 50 mg tablet  Take 1 tablet(s) EVERY 6 HOURS by oral route PRN pain, Disp: , Rfl:   No Known Allergies Review of Systems Objective:  There were no vitals filed for this visit.  General: Well developed, nourished, in no acute distress, alert and oriented x3   Dermatological: Skin is warm, dry and supple bilateral.  Nails x 10 are well maintained; remaining integument appears unremarkable at this time. There are no open sores, no preulcerative lesions, no rash or signs of infection present.  Vascular: Dorsalis Pedis artery and Posterior Tibial artery pedal pulses are 2/4 bilateral with immedate capillary fill time. Pedal hair growth present. No varicosities and no lower extremity edema present bilateral.   Neruologic: Grossly intact via light touch bilateral. Vibratory intact via tuning fork bilateral. Protective threshold with Semmes Wienstein monofilament intact to all pedal sites bilateral. Patellar and Achilles deep tendon reflexes 2+ bilateral. No Babinski or clonus noted bilateral.   Musculoskeletal: No gross boney pedal deformities bilateral. No pain, crepitus, or limitation noted with foot and ankle range of motion bilateral. Muscular strength 5/5 in all groups tested bilateral.  Gait: Unassisted, Nonantalgic.    Radiographs:  None taken  Assessment & Plan:   Assessment: Nail dystrophy cannot rule out onychomycosis  Plan: Samples of the skin and nail were taken today for pathologic evaluation.     Reid Regas T. [place], [place]

## 2018-01-01 NOTE — Telephone Encounter (Signed)
Called [facility] to have PA info sent via covermymeds

## 2018-01-03 IMAGING — DX DG HIP (WITH OR WITHOUT PELVIS) 1V*L*
3 series · 3 of 3 positions shown · non-contrast
Comparison: None.

CLINICAL DATA: Left hip/ groin pain x 2 months, no falls, no
trauma, no priors.

EXAM:
DG HIP (WITH OR WITHOUT PELVIS) 1V*L*

[pelvis ap]
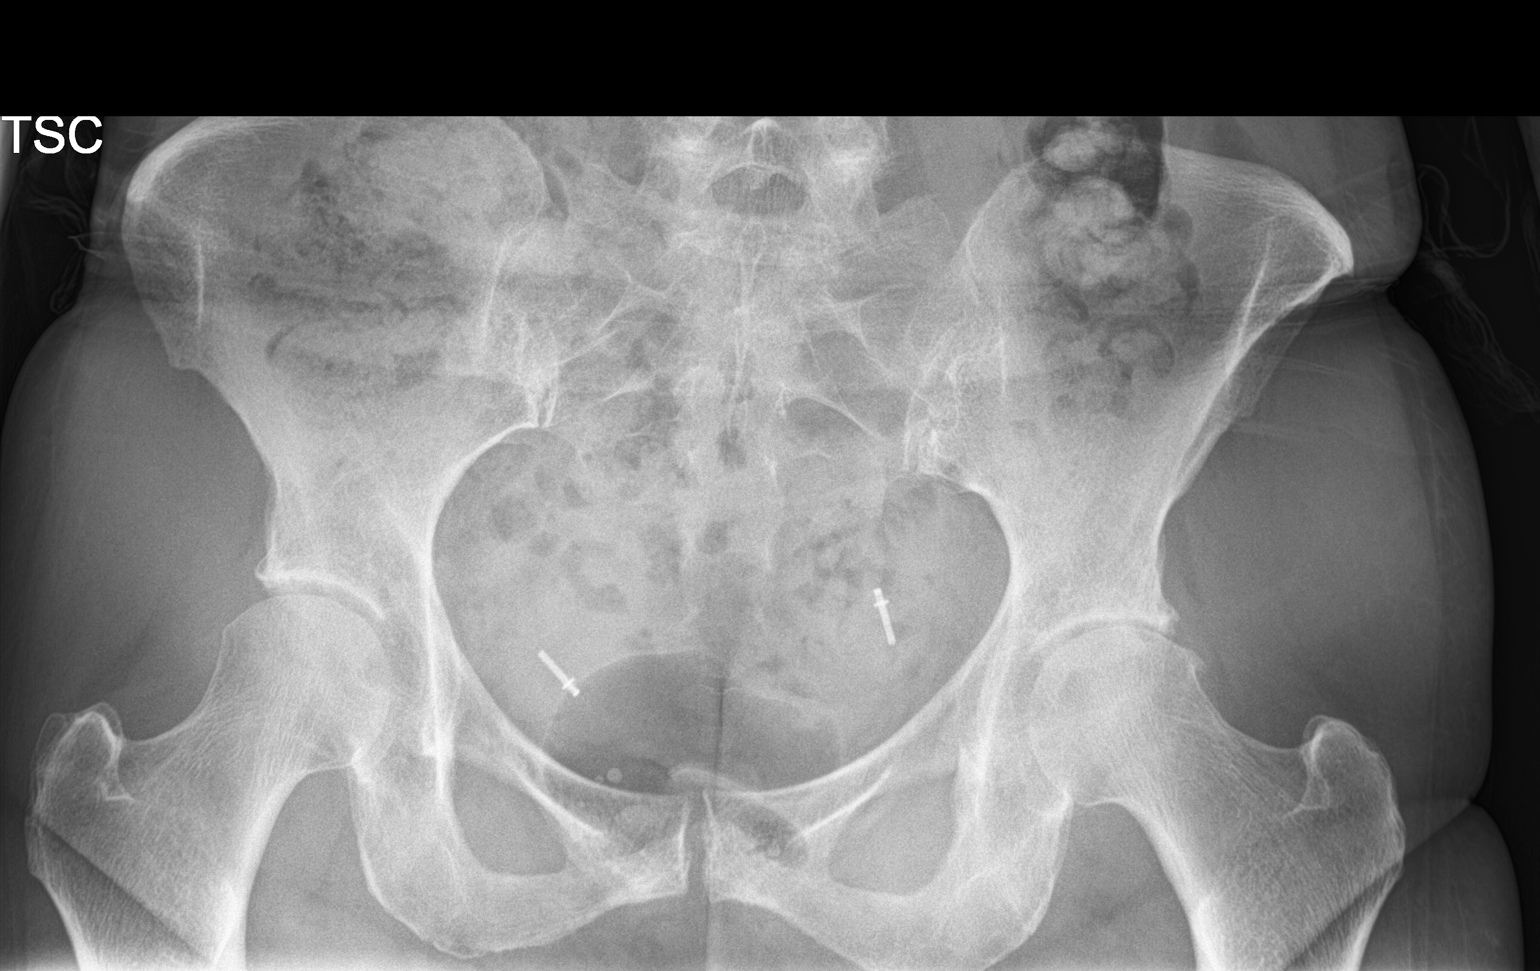

[hip ap]
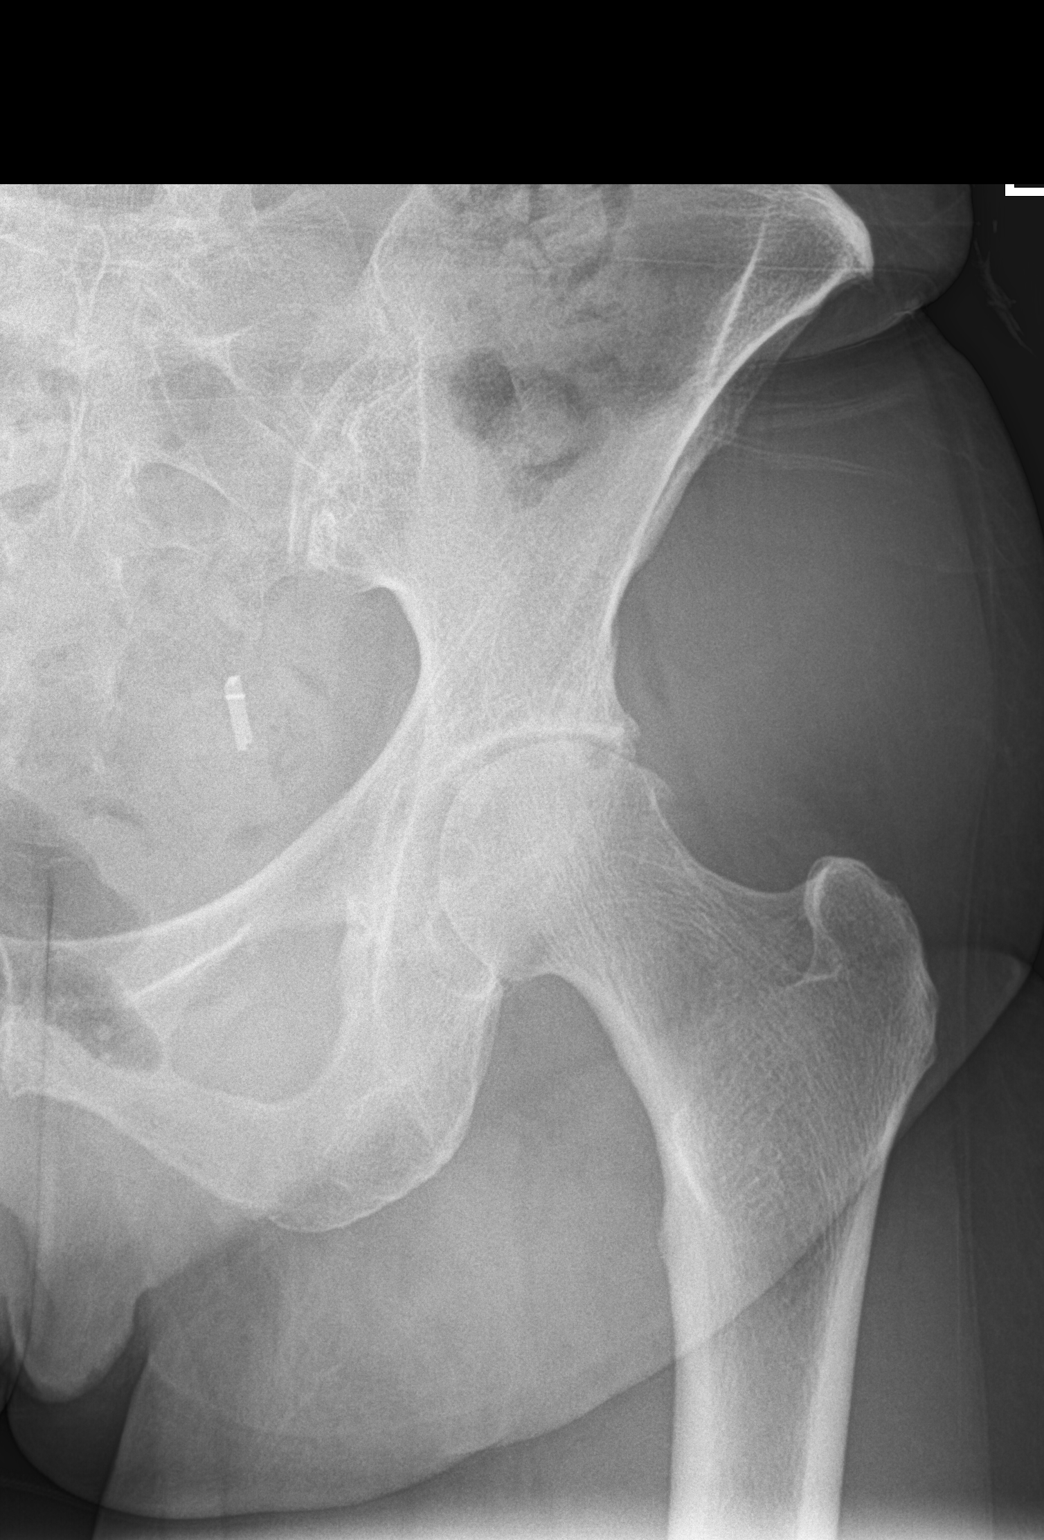

[hip lat]
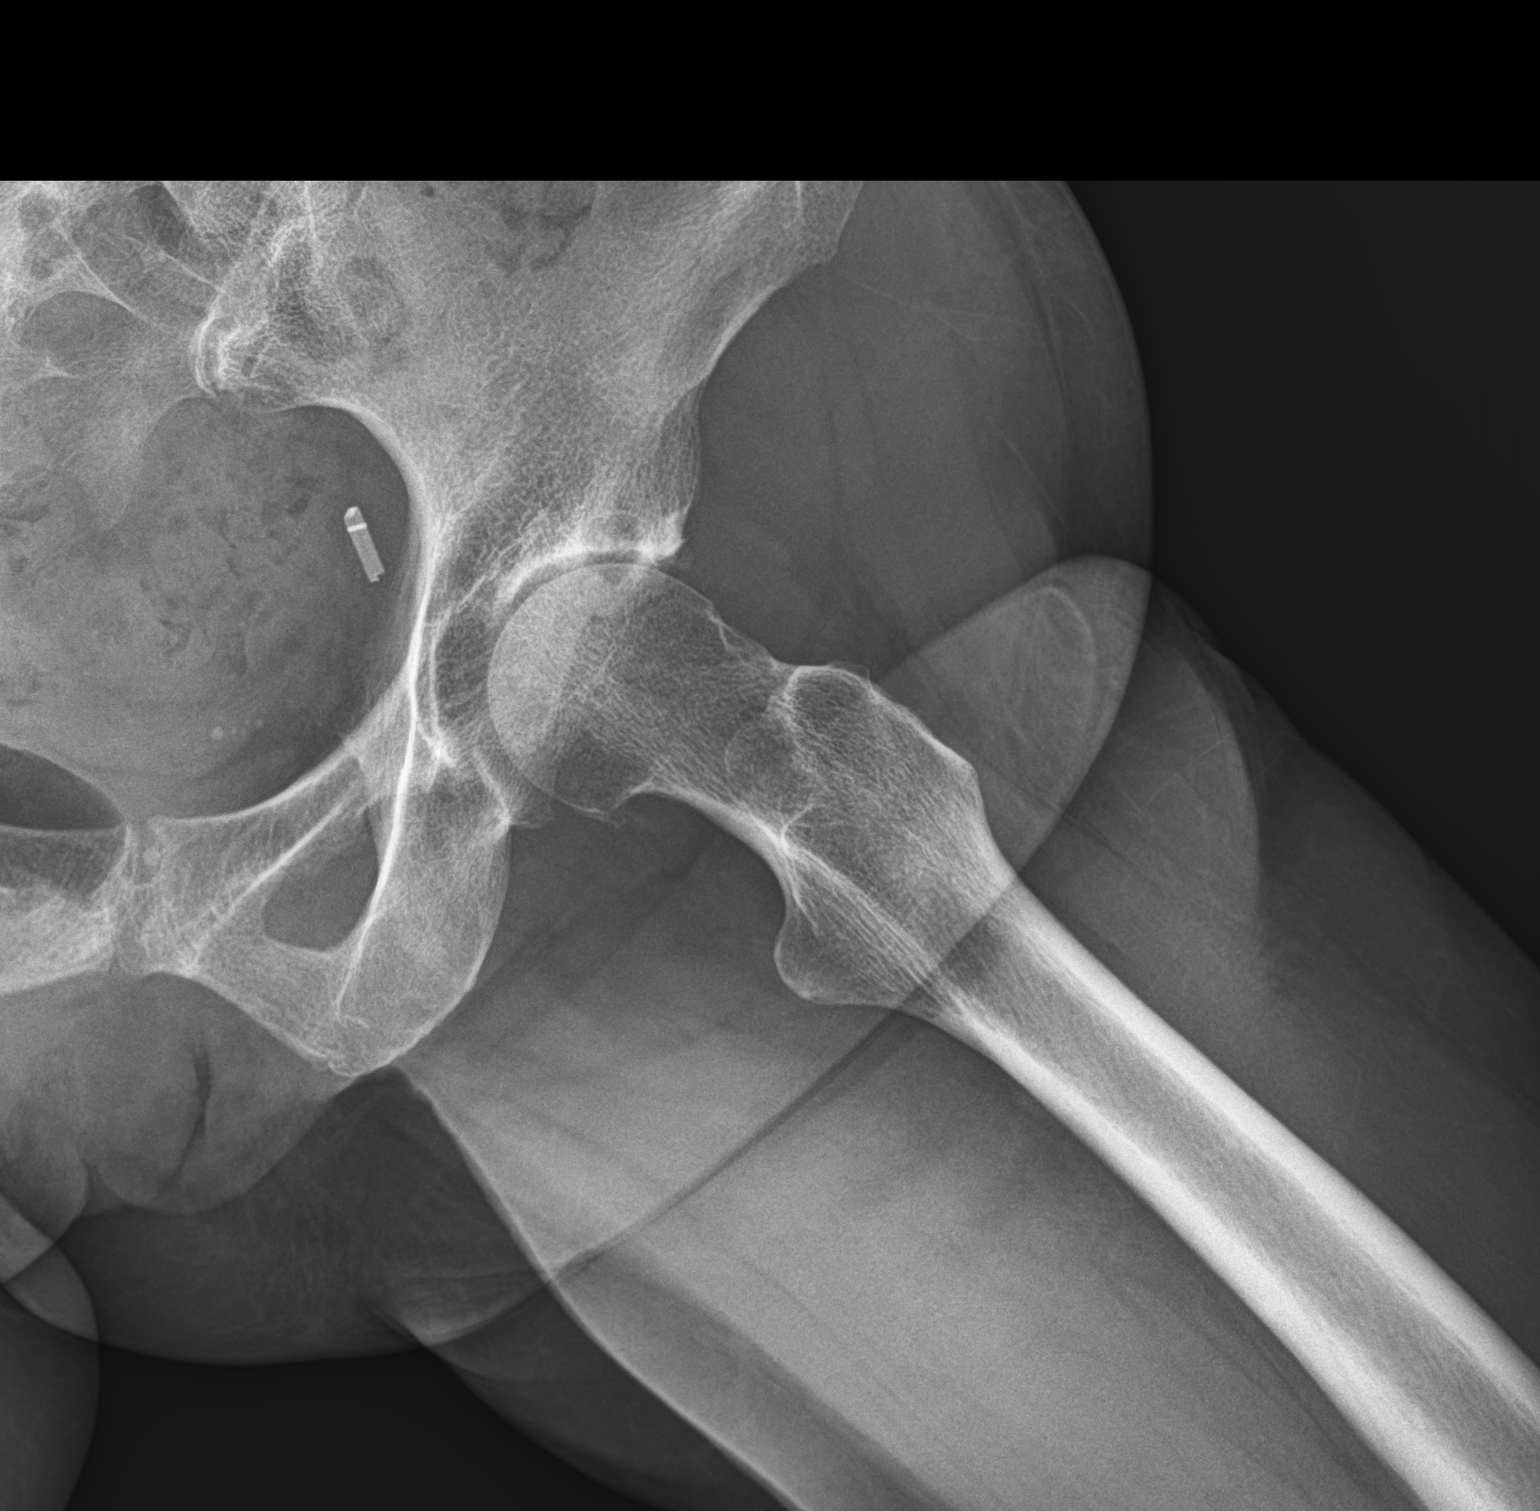

[3 of 3 positions shown; findings below may reference images not displayed]

FINDINGS: Mild cartilage loss in the left hip with mild superior migration of
the femoral head. Small marginal spurs from the acetabulum and
femoral head. Subchondral sclerosis and small cysts or geodes. No
fracture or dislocation. Pelvic phleboliths and tubal ligation clips
noted.
IMPRESSION: Left hip osteoarthritis.

## 2018-01-07 ENCOUNTER — Encounter: Payer: Self-pay | Admitting: Gynecology

## 2018-01-07 ENCOUNTER — Ambulatory Visit: Payer: Medicare HMO | Admitting: Gynecology

## 2018-01-07 VITALS — BP 118/76 | Ht 64.0 in | Wt 117.0 lb

## 2018-01-07 DIAGNOSIS — M818 Other osteoporosis without current pathological fracture: Secondary | ICD-10-CM

## 2018-01-07 DIAGNOSIS — N952 Postmenopausal atrophic vaginitis: Secondary | ICD-10-CM

## 2018-01-07 DIAGNOSIS — Z01419 Encounter for gynecological examination (general) (routine) without abnormal findings: Secondary | ICD-10-CM | POA: Diagnosis not present

## 2018-01-07 DIAGNOSIS — Z9189 Other specified personal risk factors, not elsewhere classified: Secondary | ICD-10-CM | POA: Diagnosis not present

## 2018-01-07 NOTE — Progress Notes (Signed)
    Tanya Fox 07-13-1950 [id number]        67 y.o.  G2P2002 for breast and pelvic exam.  Without gynecologic complaints  Past medical history,surgical history, problem list, medications, allergies, family history and social history were all reviewed and documented as reviewed in the EPIC chart.  ROS:  Performed with pertinent positives and negatives included in the history, assessment and plan.   Additional significant findings : None   Exam: Caryn Bee assistant Vitals:   01/07/18 1131  BP: 118/76  Weight: 117 lb (53.1 kg)  Height: 5\' 4"  (1.626 m)   Body mass index is 20.08 kg/m.  General appearance:  Normal affect, orientation and appearance. Skin: Grossly normal HEENT: Without gross lesions.  No cervical or supraclavicular adenopathy. Thyroid normal.  Lungs:  Clear without wheezing, rales or rhonchi Cardiac: RR, without RMG Abdominal:  Soft, nontender, without masses, guarding, rebound, organomegaly or hernia Breasts:  Examined lying and sitting without masses, retractions, discharge or axillary adenopathy. Pelvic:  Ext, BUS, Vagina: With atrophic changes  Cervix: With atrophic changes  Uterus: Anteverted, normal size, shape and contour, midline and mobile nontender   Adnexa: Without masses or tenderness    Anus and perineum: Normal   Rectovaginal: Normal sphincter tone without palpated masses or tenderness.    Assessment/Plan:  67 y.o. [id number] female for breast and pelvic exam.   1. Postmenopausal/atrophic genital changes.  No significant menopausal symptoms or any vaginal bleeding. 2. Osteoporosis.  DEXA 2018 T score -2.9 distal third of radius.  Right femoral neck -2.3 with 17% decline from prior study 2014.  -1.7 in AP spine with 13% decrease from prior study 2014.  We discussed treatment options as per 01/23/2017 note.  At that time the patient declined treatment but prefers exercise on a regular basis, extra vitamin D and calcium and to repeat her DEXA at  2-year interval.  We will plan DEXA next year at 2-year interval. 3. Mammography 12/2017.  History of left-sided breast cancer 2003.  Exam NED.   4. Pap smear 2018.  No Pap smear done today.  No history of abnormal Pap smears previously. 5. Colonoscopy 2014.  Repeat at their recommended interval. 6. Health maintenance.  No routine lab work done as patient does this elsewhere.  Follow-up 1 year, sooner as needed.   [name], 11:58 AM 01/07/2018

## 2018-01-07 NOTE — Patient Instructions (Signed)
Follow-up in 1 year for annual exam, sooner if any issues. 

## 2018-01-07 NOTE — Telephone Encounter (Signed)
PA was approved through the benefit year with dx of insomnia. Medication would not be covered with sleep disorder. Left message for patient to let her know medication has been approved.

## 2018-01-21 ENCOUNTER — Other Ambulatory Visit: Payer: Self-pay

## 2018-01-23 ENCOUNTER — Telehealth: Payer: Self-pay

## 2018-01-23 ENCOUNTER — Other Ambulatory Visit: Payer: Self-pay

## 2018-01-23 MED ORDER — HYDROXYZINE HCL 10 MG PO TABS: ORAL_TABLET | ORAL | 0 refills | Status: DC

## 2018-01-23 NOTE — Telephone Encounter (Signed)
Patient is checking on the status of this medication refill.  She has called the pharmacy.  She was advised that it can take up to 3 business days. Thank you

## 2018-01-28 ENCOUNTER — Ambulatory Visit: Payer: Medicare HMO | Admitting: Podiatry

## 2018-01-28 ENCOUNTER — Encounter: Payer: Self-pay | Admitting: Podiatry

## 2018-01-28 DIAGNOSIS — L603 Nail dystrophy: Secondary | ICD-10-CM

## 2018-01-28 MED ORDER — NEOMYCIN-POLYMYXIN-HC 1 % OT SOLN: OTIC | 1 refills | Status: DC

## 2018-01-28 NOTE — Progress Notes (Signed)
She presents today for results of her nail pathology.  Objective: Vital signs are stable she is alert and oriented x3 pulses are palpable.  Nails are unchanged.  Pathology does demonstrate colonization bacteria  Assessment: Bacterial colonization nails simple.  Plan: Dispensed a prescription for Cortisporin Otic to be applied distal aspect of the toe twice daily.

## 2018-01-29 DIAGNOSIS — H04203 Unspecified epiphora, bilateral lacrimal glands: Secondary | ICD-10-CM | POA: Diagnosis not present

## 2018-02-03 ENCOUNTER — Other Ambulatory Visit (INDEPENDENT_AMBULATORY_CARE_PROVIDER_SITE_OTHER): Payer: Medicare HMO

## 2018-02-03 ENCOUNTER — Ambulatory Visit (INDEPENDENT_AMBULATORY_CARE_PROVIDER_SITE_OTHER): Payer: Medicare HMO

## 2018-02-03 DIAGNOSIS — M8580 Other specified disorders of bone density and structure, unspecified site: Secondary | ICD-10-CM | POA: Diagnosis not present

## 2018-02-03 DIAGNOSIS — Z13220 Encounter for screening for lipoid disorders: Secondary | ICD-10-CM

## 2018-02-03 DIAGNOSIS — D72819 Decreased white blood cell count, unspecified: Secondary | ICD-10-CM

## 2018-02-03 DIAGNOSIS — Z23 Encounter for immunization: Secondary | ICD-10-CM | POA: Diagnosis not present

## 2018-02-03 LAB — CBC WITH DIFFERENTIAL/PLATELET
BASOS PCT: 1.3 % (ref 0.0–3.0)
Basophils Absolute: 0.1 10*3/uL (ref 0.0–0.1)
EOS ABS: 0.2 10*3/uL (ref 0.0–0.7)
EOS PCT: 5.6 % — AB (ref 0.0–5.0)
HEMATOCRIT: 39.2 % (ref 36.0–46.0)
HEMOGLOBIN: 13.5 g/dL (ref 12.0–15.0)
LYMPHS PCT: 50.1 % — AB (ref 12.0–46.0)
Lymphs Abs: 2.1 10*3/uL (ref 0.7–4.0)
MCHC: 34.3 g/dL (ref 30.0–36.0)
MCV: 93.2 fl (ref 78.0–100.0)
Monocytes Absolute: 0.4 10*3/uL (ref 0.1–1.0)
Monocytes Relative: 10.5 % (ref 3.0–12.0)
Neutro Abs: 1.3 10*3/uL — ABNORMAL LOW (ref 1.4–7.7)
Neutrophils Relative %: 32.5 % — ABNORMAL LOW (ref 43.0–77.0)
Platelets: 210 10*3/uL (ref 150.0–400.0)
RBC: 4.2 Mil/uL (ref 3.87–5.11)
RDW: 13.5 % (ref 11.5–15.5)
WBC: 4.1 10*3/uL (ref 4.0–10.5)

## 2018-02-03 LAB — HEPATIC FUNCTION PANEL
ALT: 11 U/L (ref 0–35)
AST: 18 U/L (ref 0–37)
Albumin: 4.2 g/dL (ref 3.5–5.2)
Alkaline Phosphatase: 80 U/L (ref 39–117)
BILIRUBIN TOTAL: 0.5 mg/dL (ref 0.2–1.2)
Bilirubin, Direct: 0.1 mg/dL (ref 0.0–0.3)
Total Protein: 6.7 g/dL (ref 6.0–8.3)

## 2018-02-03 LAB — BASIC METABOLIC PANEL
BUN: 17 mg/dL (ref 6–23)
CALCIUM: 9.3 mg/dL (ref 8.4–10.5)
CO2: 31 meq/L (ref 19–32)
CREATININE: 0.67 mg/dL (ref 0.40–1.20)
Chloride: 104 mEq/L (ref 96–112)
GFR: 93.8 mL/min (ref >60.00)
Glucose, Bld: 86 mg/dL (ref 70–99)
POTASSIUM: 4.1 meq/L (ref 3.5–5.1)
SODIUM: 140 meq/L (ref 135–145)

## 2018-02-03 LAB — LIPID PANEL
CHOL/HDL RATIO: 3
Cholesterol: 208 mg/dL — ABNORMAL HIGH (ref 0–200)
HDL: 77 mg/dL (ref >39.00)
LDL CALC: 117 mg/dL — AB (ref 0–99)
NonHDL: 131.44
Triglycerides: 73 mg/dL (ref 0.0–149.0)
VLDL: 14.6 mg/dL (ref 0.0–40.0)

## 2018-02-03 LAB — TSH: TSH: 3.14 u[IU]/mL (ref 0.35–4.50)

## 2018-02-03 LAB — VITAMIN D 25 HYDROXY (VIT D DEFICIENCY, FRACTURES): VITD: 38.95 ng/mL (ref 30.00–100.00)

## 2018-02-04 ENCOUNTER — Other Ambulatory Visit: Payer: Self-pay | Admitting: Internal Medicine

## 2018-02-04 DIAGNOSIS — D72820 Lymphocytosis (symptomatic): Secondary | ICD-10-CM

## 2018-02-04 NOTE — Progress Notes (Signed)
Order placed for f/u cbc.   

## 2018-03-01 ENCOUNTER — Other Ambulatory Visit: Payer: Self-pay | Admitting: Internal Medicine

## 2018-03-02 ENCOUNTER — Other Ambulatory Visit: Payer: Self-pay

## 2018-03-02 MED ORDER — HYDROXYZINE HCL 10 MG PO TABS: ORAL_TABLET | ORAL | 1 refills | Status: DC

## 2018-03-02 NOTE — Telephone Encounter (Signed)
rx ok'd for hydroxyzine #60 with one refill.

## 2018-03-05 ENCOUNTER — Other Ambulatory Visit: Payer: Medicare HMO

## 2018-03-24 ENCOUNTER — Other Ambulatory Visit: Payer: Medicare HMO

## 2018-04-12 ENCOUNTER — Encounter: Payer: Self-pay | Admitting: Internal Medicine

## 2018-04-16 NOTE — Telephone Encounter (Signed)
Copied from [place] [id number]. Topic: Quick Communication - Rx Refill/Question >> Apr 16, 2018  9:33 AM Alanda Slim E wrote: Medication: hydrOXYzine (ATARAX/VISTARIL) 10 MG tablet  Has the patient contacted their pharmacy? No.   Preferred Pharmacy (with phone number or street name): [place], [place] - [address] [phone] (Phone) [phone] (Fax)    Agent: Please be advised that RX refills may take up to 3 business days. We ask that you follow-up with your pharmacy.

## 2018-04-17 ENCOUNTER — Other Ambulatory Visit (INDEPENDENT_AMBULATORY_CARE_PROVIDER_SITE_OTHER): Payer: Medicare HMO

## 2018-04-17 DIAGNOSIS — D72820 Lymphocytosis (symptomatic): Secondary | ICD-10-CM | POA: Diagnosis not present

## 2018-04-17 LAB — CBC WITH DIFFERENTIAL/PLATELET
Basophils Absolute: 0 10*3/uL (ref 0.0–0.1)
Basophils Relative: 1 % (ref 0.0–3.0)
EOS PCT: 5.3 % — AB (ref 0.0–5.0)
Eosinophils Absolute: 0.2 10*3/uL (ref 0.0–0.7)
HCT: 39.7 % (ref 36.0–46.0)
Hemoglobin: 13.4 g/dL (ref 12.0–15.0)
LYMPHS ABS: 2 10*3/uL (ref 0.7–4.0)
Lymphocytes Relative: 49.7 % — ABNORMAL HIGH (ref 12.0–46.0)
MCHC: 33.8 g/dL (ref 30.0–36.0)
MCV: 93.9 fl (ref 78.0–100.0)
MONOS PCT: 9.5 % (ref 3.0–12.0)
Monocytes Absolute: 0.4 10*3/uL (ref 0.1–1.0)
NEUTROS ABS: 1.4 10*3/uL (ref 1.4–7.7)
NEUTROS PCT: 34.5 % — AB (ref 43.0–77.0)
Platelets: 199 10*3/uL (ref 150.0–400.0)
RBC: 4.23 Mil/uL (ref 3.87–5.11)
RDW: 13.2 % (ref 11.5–15.5)
WBC: 4 10*3/uL (ref 4.0–10.5)

## 2018-04-19 ENCOUNTER — Encounter: Payer: Self-pay | Admitting: Internal Medicine

## 2018-04-20 DIAGNOSIS — M25511 Pain in right shoulder: Secondary | ICD-10-CM | POA: Diagnosis not present

## 2018-04-22 DIAGNOSIS — H04221 Epiphora due to insufficient drainage, right lacrimal gland: Secondary | ICD-10-CM | POA: Diagnosis not present

## 2018-04-28 ENCOUNTER — Other Ambulatory Visit: Payer: Self-pay

## 2018-04-28 DIAGNOSIS — R69 Illness, unspecified: Secondary | ICD-10-CM | POA: Diagnosis not present

## 2018-04-28 MED ORDER — HYDROXYZINE HCL 10 MG PO TABS: ORAL_TABLET | ORAL | 1 refills | Status: DC

## 2018-04-28 NOTE — Telephone Encounter (Signed)
I will call patient and let her know that I just received this message. Are you okay with refilling

## 2018-04-28 NOTE — Telephone Encounter (Signed)
Rx sent in, patient aware  

## 2018-06-03 ENCOUNTER — Ambulatory Visit (INDEPENDENT_AMBULATORY_CARE_PROVIDER_SITE_OTHER): Payer: Medicare HMO | Admitting: Family

## 2018-06-03 ENCOUNTER — Encounter: Payer: Self-pay | Admitting: Family

## 2018-06-03 VITALS — BP 92/66 | HR 81 | Temp 97.7°F | Wt 117.8 lb

## 2018-06-03 DIAGNOSIS — M6289 Other specified disorders of muscle: Secondary | ICD-10-CM | POA: Diagnosis not present

## 2018-06-03 NOTE — Progress Notes (Signed)
Subjective:    Patient ID: Tanya Fox, female    DOB: 1951-01-31, 68 y.o.   MRN: [id number]  CC: Tanya Fox is a 68 y.o. female who presents today for an acute visit.    HPI: Appreciated a bulge lesser pubic area approximately 4 days ago, unsure as may be a little better today.  First noticed 4 days ago after she been walking.  Has always noted a little section on left side of scar which 'stuck out.' Describes pain as a dull ache.  She knows it is there.  Pain is not severe.  She has not been doubled over in pain.  She is having regular bowel movements.  No dysuria.  No pelvic pain.    She has noticed that it she raises left leg up, left groin feels 'tender' which started a couple of weeks ago.   No low back pain or numbness in legs. No falls.                        Left hip replacement 2018- Dr Berenice Primas 2 c/s   HISTORY:  Past Medical History:  Diagnosis Date  . Atrophic vaginitis   . BRCA negative   . Breast cancer ([place]) 2003   Left side, Stage I  . Collar bone fracture   . Osteopenia 11/2012   T score -1.2 FRAX 6.6%/0.5%  . Osteoporosis 12/2016   T score -2.9 distal third of the radius   Past Surgical History:  Procedure Laterality Date  . BREAST SURGERY  2003-2004   Lumpectomy-Lymph nodes-Port a cath-Radiation and Chemo  . CESAREAN SECTION     X 2  . TOTAL HIP ARTHROPLASTY     Left  . TUBAL LIGATION     Family History  Problem Relation Age of Onset  . Hypertension Mother   . Breast cancer Mother        Age 28  . Heart disease Father   . Alcohol abuse Father   . Breast cancer Sister        Age 36  . Cancer Maternal Grandfather        Colon cancer  . Heart disease Maternal Grandfather   . Diabetes Paternal Grandmother     Allergies: Patient has no known allergies. Current Outpatient Medications on File Prior to Visit  Medication Sig Dispense Refill  . Cholecalciferol (VITAMIN D PO) Take by mouth.    . hydrOXYzine (ATARAX/VISTARIL) 10 MG tablet  Take 1-2 tablets q hs prn 60 tablet 1  . Multiple Vitamin (MULTIVITAMIN) tablet Take 1 tablet by mouth daily.      . NEOMYCIN-POLYMYXIN-HYDROCORTISONE (CORTISPORIN) 1 % SOLN OTIC solution Apply 1-2 drops to toe BID after soaking 10 mL 1   No current facility-administered medications on file prior to visit.     Social History   Tobacco Use  . Smoking status: Never Smoker  . Smokeless tobacco: Never Used  Substance Use Topics  . Alcohol use: Yes    Alcohol/week: 0.0 standard drinks    Comment: very rare  . Drug use: No    Review of Systems  Constitutional: Negative for chills and fever.  Respiratory: Negative for cough.   Cardiovascular: Negative for chest pain and palpitations.  Gastrointestinal: Negative for abdominal distention, abdominal pain, constipation, diarrhea, nausea and vomiting.  Genitourinary: Positive for pelvic pain. Negative for dysuria, urgency and vaginal pain.  Musculoskeletal: Negative for back pain.  Neurological: Negative for numbness.  Objective:    BP 92/66 (BP Location: Left Arm, Patient Position: Sitting, Cuff Size: Normal)   Pulse 81   Temp 97.7 F (36.5 C)   Wt 117 lb 12.8 oz (53.4 kg)   SpO2 96%   BMI 20.22 kg/m    Physical Exam Vitals signs reviewed.  Constitutional:      Appearance: Normal appearance. She is well-developed.  Eyes:     Conjunctiva/sclera: Conjunctivae normal.  Cardiovascular:     Rate and Rhythm: Normal rate and regular rhythm.     Pulses: Normal pulses.     Heart sounds: Normal heart sounds.  Pulmonary:     Effort: Pulmonary effort is normal.     Breath sounds: Normal breath sounds. No wheezing, rhonchi or rales.  Abdominal:     General: Bowel sounds are normal. There is no distension.     Palpations: Abdomen is soft. Abdomen is not rigid. There is no fluid wave or mass.     Tenderness: There is no abdominal tenderness. There is no guarding or rebound.     Hernia: A hernia is present.       Comments: Bulge  noted left lower suprapubic area, as noted on diagram.  Approximately 2 x 3 cm..  Not exquisitely tender.  Reducible.   Musculoskeletal:     Left hip: She exhibits normal range of motion, normal strength and no tenderness.     Lumbar back: She exhibits normal range of motion, no tenderness and no bony tenderness.     Comments: Left Hip: No limp or waddling gait. Full ROM with flexion and hip rotation in flexion.    No pain of lateral hip with  (flexion-abduction-external rotation) test.   No pain with deep palpation of greater trochanter.    Skin:    General: Skin is warm and dry.  Neurological:     Mental Status: She is alert.  Psychiatric:        Speech: Speech normal.        Behavior: Behavior normal.        Thought Content: Thought content normal.        Assessment & Plan:   Problem List Items Addressed This Visit      Other   Hernia of fascia - Primary    Presentation supports hernia.  Clinical exam does not support hip etiology at this time.  Discussed imaging with patient today, we jointly agreed we would defer this to Dr. Terri Piedra.  Referral has been placed.  Patient will remain vigilant and let me know if any new or worsening symptoms.      Relevant Orders   Ambulatory referral to General Surgery       I am having Tanya Fox maintain her multivitamin, Cholecalciferol (VITAMIN D PO), NEOMYCIN-POLYMYXIN-HYDROCORTISONE, and hydrOXYzine.   No orders of the defined types were placed in this encounter.   Return precautions given.   Risks, benefits, and alternatives of the medications and treatment plan prescribed today were discussed, and patient expressed understanding.   Education regarding symptom management and diagnosis given to patient on AVS.  Continue to follow with Einar Pheasant, MD for routine health maintenance.   Tanya Fox and I agreed with plan.   Tanya Paris, FNP

## 2018-06-03 NOTE — Assessment & Plan Note (Addendum)
Presentation supports hernia.  Clinical exam does not support hip etiology at this time.  Discussed imaging with patient today, we jointly agreed we would defer this to Dr. Terri Piedra.  Referral has been placed.  Patient will remain vigilant and let me know if any new or worsening symptoms.

## 2018-06-03 NOTE — Patient Instructions (Signed)
I suspect aspect of hernia as we discussed today.  Of course if you develop any new symptoms, abdominal pain, diarrhea, fever OR any other concerns, please let me know.  Otherwise we will send you for surgical consult.  Today we discussed referrals, orders. General surgery ( Dr Terri Piedra)   I have placed these orders in the system for you.  Please be sure to give [place] a call if you have not heard from our office regarding this. We should hear from [place] within ONE week with information regarding your appointment. If not, please let me know immediately.    Nice to meet you!

## 2018-06-09 ENCOUNTER — Telehealth: Payer: Self-pay

## 2018-06-09 NOTE — Telephone Encounter (Signed)
Copied from [place] [id number]. Topic: Referral - Question >> Jun 09, 2018 12:33 PM Tanya Fox, [place] C wrote: Reason for CRM: pt currently have a referral to [facility]. Pt says that she would rather be seen at [facility] by Dr. Lysle Pearl instead.   Pt would like a call to be advised once referral is updated.

## 2018-06-09 NOTE — Telephone Encounter (Signed)
Can we send this referral over to [facility]?

## 2018-06-10 NOTE — Telephone Encounter (Signed)
Submitted to [facility] for Dr. Lysle Pearl. Mychart message sent to her regarding her referral.

## 2018-06-15 DIAGNOSIS — K4090 Unilateral inguinal hernia, without obstruction or gangrene, not specified as recurrent: Secondary | ICD-10-CM | POA: Diagnosis not present

## 2018-06-16 ENCOUNTER — Ambulatory Visit: Payer: Medicare HMO | Admitting: General Surgery

## 2018-08-11 ENCOUNTER — Ambulatory Visit: Payer: Medicare HMO | Admitting: Internal Medicine

## 2018-08-11 DIAGNOSIS — M545 Low back pain, unspecified: Secondary | ICD-10-CM

## 2018-08-12 ENCOUNTER — Telehealth: Payer: Self-pay | Admitting: Internal Medicine

## 2018-08-12 NOTE — Telephone Encounter (Signed)
tried to cal patient to see if I could see if I could attain information to back pain and to verify if patient would like a referral to ortho for her back.

## 2018-08-12 NOTE — Telephone Encounter (Signed)
Patient returned call and said she realized it was a misunderstanding on her part as for the visit on 08/11/18, patient says she has an orthopaedic MD that she will call because the pain is in the lower back where she has had hip surgery, I ask if she would like referral she said she did not think she would need one but would call the office if she did need one . Patient said she was sorry that she could not connect with PCP yesterday and she appreciated the call today.

## 2018-08-15 ENCOUNTER — Encounter: Payer: Self-pay | Admitting: Internal Medicine

## 2018-08-15 NOTE — Progress Notes (Signed)
Pt did not connect for appt.  Was not evaluated.

## 2018-09-04 ENCOUNTER — Telehealth: Payer: Self-pay | Admitting: Internal Medicine

## 2018-09-04 ENCOUNTER — Other Ambulatory Visit: Payer: Self-pay

## 2018-09-04 ENCOUNTER — Other Ambulatory Visit: Payer: Self-pay | Admitting: Internal Medicine

## 2018-09-04 MED ORDER — HYDROXYZINE HCL 10 MG PO TABS: ORAL_TABLET | ORAL | 0 refills | Status: DC

## 2018-09-04 MED ORDER — HYDROXYZINE HCL 10 MG PO TABS: ORAL_TABLET | ORAL | 1 refills | Status: DC

## 2018-09-04 NOTE — Telephone Encounter (Unsigned)
Copied from [place] [id number]. Topic: Quick Communication - Rx Refill/Question >> Sep 04, 2018  9:39 AM Celene Kras A wrote: Medication: hydrOXYzine (ATARAX/VISTARIL) 10 MG tablet  Has the patient contacted their pharmacy? No. (Agent: If no, request that the patient contact the pharmacy for the refill.) (Agent: If yes, when and what did the pharmacy advise?)  Preferred Pharmacy (with phone number or street name): [place], [place] - [address] [place] [address] Phone: [phone] Fax: [phone] Not a 24 hour pharmacy; exact hours not known.    Agent: Please be advised that RX refills may take up to 3 business days. We ask that you follow-up with your pharmacy.

## 2018-09-04 NOTE — Progress Notes (Signed)
rx ok'd for hydroxyzine #60 with no refills.  Declined rx for #60 with 1 refill.    Dr Nicki Reaper

## 2018-09-04 NOTE — Telephone Encounter (Signed)
Refill for hydroxyzine was sent to pharmacy per pt request.  Yardley Beltran,cma

## 2018-09-22 DIAGNOSIS — M545 Low back pain: Secondary | ICD-10-CM | POA: Diagnosis not present

## 2018-09-24 ENCOUNTER — Telehealth: Payer: Self-pay

## 2018-09-24 NOTE — Telephone Encounter (Signed)
Just an FYI for you

## 2018-09-24 NOTE — Telephone Encounter (Signed)
Spoke with pt and she stated that for three days now she has had a sore throat, red spots on the side of her tongue and the back of her throat is red. No own exposure to Covid-19. Pt stated that no other symptoms. Pt stated that she went to the orthopedic on Tuesday and he prescribed her prednisone for inflammation in her lower back. Pt stated that is the only change that she has had. Tried to schedule pt for a virtual appt tomorrow and she wanted to be seen in the office. I explained to her that since she is having symptoms of a sore throat that would not be able come in the office and that she would need to go to Urgent Care. Pt did not like that but stated that she would go to Urgent Care.

## 2018-10-20 ENCOUNTER — Encounter: Payer: Medicare HMO | Admitting: Internal Medicine

## 2018-10-26 ENCOUNTER — Other Ambulatory Visit: Payer: Self-pay

## 2018-10-26 ENCOUNTER — Telehealth: Payer: Self-pay | Admitting: Internal Medicine

## 2018-10-26 MED ORDER — HYDROXYZINE HCL 10 MG PO TABS: ORAL_TABLET | ORAL | 0 refills | Status: DC

## 2018-10-26 NOTE — Telephone Encounter (Signed)
rx sent in 

## 2018-10-26 NOTE — Telephone Encounter (Signed)
Medication Refill - Medication: hydrOXYzine (ATARAX/VISTARIL) 10 MG tablet    Has the patient contacted their pharmacy? No  (Agent: If no, request that the patient contact the pharmacy for the refill.) (Agent: If yes, when and what did the pharmacy advise?)  Preferred Pharmacy (with phone number or street name):  [place], [place] - [address] [phone] (Phone) [phone] (Fax)     Agent: Please be advised that RX refills may take up to 3 business days. We ask that you follow-up with your pharmacy.

## 2018-10-28 ENCOUNTER — Telehealth: Payer: Self-pay

## 2018-10-28 NOTE — Telephone Encounter (Signed)
error 

## 2018-11-02 DIAGNOSIS — M545 Low back pain: Secondary | ICD-10-CM | POA: Diagnosis not present

## 2018-11-03 DIAGNOSIS — R69 Illness, unspecified: Secondary | ICD-10-CM | POA: Diagnosis not present

## 2018-11-09 DIAGNOSIS — M545 Low back pain: Secondary | ICD-10-CM | POA: Diagnosis not present

## 2018-11-10 DIAGNOSIS — H2513 Age-related nuclear cataract, bilateral: Secondary | ICD-10-CM | POA: Diagnosis not present

## 2018-11-10 DIAGNOSIS — H04223 Epiphora due to insufficient drainage, bilateral lacrimal glands: Secondary | ICD-10-CM | POA: Diagnosis not present

## 2018-11-10 DIAGNOSIS — H04123 Dry eye syndrome of bilateral lacrimal glands: Secondary | ICD-10-CM | POA: Diagnosis not present

## 2018-11-16 ENCOUNTER — Other Ambulatory Visit: Payer: Self-pay

## 2018-11-17 ENCOUNTER — Ambulatory Visit (INDEPENDENT_AMBULATORY_CARE_PROVIDER_SITE_OTHER): Payer: Medicare HMO | Admitting: Internal Medicine

## 2018-11-17 VITALS — BP 92/64 | HR 72 | Temp 98.5°F | Ht 63.0 in | Wt 119.4 lb

## 2018-11-17 DIAGNOSIS — Z0000 Encounter for general adult medical examination without abnormal findings: Secondary | ICD-10-CM | POA: Diagnosis not present

## 2018-11-17 DIAGNOSIS — G479 Sleep disorder, unspecified: Secondary | ICD-10-CM | POA: Diagnosis not present

## 2018-11-17 DIAGNOSIS — Z13220 Encounter for screening for lipoid disorders: Secondary | ICD-10-CM

## 2018-11-17 DIAGNOSIS — Z23 Encounter for immunization: Secondary | ICD-10-CM

## 2018-11-17 DIAGNOSIS — Z853 Personal history of malignant neoplasm of breast: Secondary | ICD-10-CM | POA: Diagnosis not present

## 2018-11-17 DIAGNOSIS — D72819 Decreased white blood cell count, unspecified: Secondary | ICD-10-CM | POA: Diagnosis not present

## 2018-11-17 NOTE — Assessment & Plan Note (Addendum)
Mammogram 01/01/18 - Birads II.  Colonoscopy 07/2012.  IFOB.  Breast and pelvic exams through gyn.

## 2018-11-17 NOTE — Progress Notes (Signed)
Patient ID: Tanya Fox, female   DOB: 09/29/1950, 68 y.o.   MRN: [id number]   Subjective:    Patient ID: Tanya Fox, female    DOB: 15-Jun-1950, 68 y.o.   MRN: [id number]  HPI  Patient here for a scheduled physical exam.  Gets her breast and pelvic exams through gyn.  Up to date.  Due in the fall.  States she is doing well.  Feels good.  Stays active.  No chest pain.  No sob.  No acid reflux.  No abdominal pain.  Bowels moving.  Discussed immunization.  Needs prevnar.  Had colonoscopy 07/2012.    Past Medical History:  Diagnosis Date  . Atrophic vaginitis   . BRCA negative   . Breast cancer ([place]) 2003   Left side, Stage I  . Collar bone fracture   . Osteopenia 11/2012   T score -1.2 FRAX 6.6%/0.5%  . Osteoporosis 12/2016   T score -2.9 distal third of the radius   Past Surgical History:  Procedure Laterality Date  . BREAST SURGERY  2003-2004   Lumpectomy-Lymph nodes-Port a cath-Radiation and Chemo  . CESAREAN SECTION     X 2  . TOTAL HIP ARTHROPLASTY     Left  . TUBAL LIGATION     Family History  Problem Relation Age of Onset  . Hypertension Mother   . Breast cancer Mother        Age 46  . Heart disease Father   . Alcohol abuse Father   . Breast cancer Sister        Age 66  . Cancer Maternal Grandfather        Colon cancer  . Heart disease Maternal Grandfather   . Diabetes Paternal Grandmother    Social History   Socioeconomic History  . Marital status: Married    Spouse name: Not on file  . Number of children: 2  . Years of education: Not on file  . Highest education level: Not on file  Occupational History    Employer: [employer]  . Financial resource strain: Not on file  . Food insecurity    Worry: Not on file    Inability: Not on file  . Transportation needs    Medical: Not on file    Non-medical: Not on file  Tobacco Use  . Smoking status: Never Smoker  . Smokeless tobacco: Never Used  Substance and Sexual Activity   . Alcohol use: Yes    Alcohol/week: 0.0 standard drinks    Comment: very rare  . Drug use: No  . Sexual activity: Yes    Birth control/protection: Surgical    Comment: BTL-1st intercourse 74 yo-1 partner  Lifestyle  . Physical activity    Days per week: Not on file    Minutes per session: Not on file  . Stress: Not on file  Relationships  . Social Herbalist on phone: Not on file    Gets together: Not on file    Attends religious service: Not on file    Active member of club or organization: Not on file    Attends meetings of clubs or organizations: Not on file    Relationship status: Not on file  Other Topics Concern  . Not on file  Social History Narrative  . Not on file    Outpatient Encounter Medications as of 11/17/2018  Medication Sig  . Cholecalciferol (VITAMIN D PO) Take by mouth.  . hydrOXYzine (ATARAX/VISTARIL)  10 MG tablet Take 1-2 tablets q hs prn  . Multiple Vitamin (MULTIVITAMIN) tablet Take 1 tablet by mouth daily.    . NEOMYCIN-POLYMYXIN-HYDROCORTISONE (CORTISPORIN) 1 % SOLN OTIC solution Apply 1-2 drops to toe BID after soaking   No facility-administered encounter medications on file as of 11/17/2018.     Review of Systems  Constitutional: Negative for appetite change and unexpected weight change.  HENT: Negative for congestion and sinus pressure.   Eyes: Negative for pain and visual disturbance.  Respiratory: Negative for cough, chest tightness and shortness of breath.   Cardiovascular: Negative for chest pain, palpitations and leg swelling.  Gastrointestinal: Negative for abdominal pain, diarrhea, nausea and vomiting.  Genitourinary: Negative for difficulty urinating and dysuria.  Musculoskeletal: Negative for joint swelling and myalgias.  Skin: Negative for color change and rash.  Neurological: Negative for dizziness, light-headedness and headaches.  Hematological: Negative for adenopathy. Does not bruise/bleed easily.   Psychiatric/Behavioral: Negative for agitation and dysphoric mood.       Objective:    Physical Exam Constitutional:      General: She is not in acute distress.    Appearance: Normal appearance.  HENT:     Right Ear: External ear normal.     Left Ear: External ear normal.  Eyes:     General: No scleral icterus.       Right eye: No discharge.        Left eye: No discharge.     Conjunctiva/sclera: Conjunctivae normal.  Neck:     Musculoskeletal: Neck supple. No muscular tenderness.     Thyroid: No thyromegaly.  Cardiovascular:     Rate and Rhythm: Normal rate and regular rhythm.  Pulmonary:     Effort: No respiratory distress.     Breath sounds: Normal breath sounds. No wheezing.     Comments: Breasts:  Performed by gyn.  Abdominal:     General: Bowel sounds are normal.     Palpations: Abdomen is soft.     Tenderness: There is no abdominal tenderness.  Genitourinary:    Comments: Performed by gyn.  Musculoskeletal:        General: No swelling or tenderness.  Lymphadenopathy:     Cervical: No cervical adenopathy.  Skin:    Findings: No erythema or rash.  Neurological:     Mental Status: She is alert.  Psychiatric:        Mood and Affect: Mood normal.        Behavior: Behavior normal.     BP 92/64   Pulse 72   Temp 98.5 F (36.9 C)   Ht _0  (1.6 m)   Wt 119 lb 6.4 oz (54.2 kg)   SpO2 96%   BMI 21.15 kg/m  Wt Readings from Last 3 Encounters:  11/17/18 119 lb 6.4 oz (54.2 kg)  06/03/18 117 lb 12.8 oz (53.4 kg)  01/07/18 117 lb (53.1 kg)     Lab Results  Component Value Date   WBC 4.0 04/17/2018   HGB 13.4 04/17/2018   HCT 39.7 04/17/2018   PLT 199.0 04/17/2018   GLUCOSE 86 02/03/2018   CHOL 208 (H) 02/03/2018   TRIG 73.0 02/03/2018   HDL 77.00 02/03/2018   LDLCALC 117 (H) 02/03/2018   ALT 11 02/03/2018   AST 18 02/03/2018   NA 140 02/03/2018   K 4.1 02/03/2018   CL 104 02/03/2018   CREATININE 0.67 02/03/2018   BUN 17 02/03/2018   CO2 31  02/03/2018   TSH 3.14  02/03/2018   INR 1.2 (H) 03/28/2016       Assessment & Plan:   Problem List Items Addressed This Visit    Health care maintenance    Mammogram 01/01/18 - Birads II.  Colonoscopy 07/2012.  IFOB.  Breast and pelvic exams through gyn.       History of breast cancer    GYN orders mammogram.  Last 12/22/17 - Birads II.       Leukopenia    Follow cbc.       Relevant Orders   CBC with Differential/Platelet   Comprehensive metabolic panel   TSH   Sleeping difficulties    Using hydroxyzine to help with sleep.  Doing well with this medication.  Follow.         Other Visit Diagnoses    Need for pneumococcal vaccine    -  Primary   Relevant Orders   Pneumococcal conjugate vaccine 13-valent IM (Completed)   Screening cholesterol level       Relevant Orders   Lipid panel       Einar Pheasant, MD

## 2018-11-18 ENCOUNTER — Telehealth: Payer: Self-pay | Admitting: Internal Medicine

## 2018-11-18 DIAGNOSIS — Z1211 Encounter for screening for malignant neoplasm of colon: Secondary | ICD-10-CM

## 2018-11-18 NOTE — Telephone Encounter (Signed)
Pt called in to be advised about stool sample. Pt says that she has the kit but was told that the order wasn't placed by provider. Pt would like further assistance with this  Please advise.

## 2018-11-18 NOTE — Telephone Encounter (Signed)
Order placed for ifob 

## 2018-11-18 NOTE — Telephone Encounter (Signed)
I was not in the office when this was given to her but from the patients description it sounds like she was given an ifob kit. Just wanted to be sure before I placed the order.

## 2018-11-18 NOTE — Telephone Encounter (Signed)
Pt need to know how to get the kit back to the office. Please call pt.

## 2018-11-18 NOTE — Telephone Encounter (Signed)
Advised pt should mail back

## 2018-11-20 ENCOUNTER — Other Ambulatory Visit (INDEPENDENT_AMBULATORY_CARE_PROVIDER_SITE_OTHER): Payer: Medicare HMO

## 2018-11-20 DIAGNOSIS — Z1211 Encounter for screening for malignant neoplasm of colon: Secondary | ICD-10-CM | POA: Diagnosis not present

## 2018-11-20 LAB — FECAL OCCULT BLOOD, IMMUNOCHEMICAL: Fecal Occult Bld: NEGATIVE

## 2018-11-21 ENCOUNTER — Encounter: Payer: Self-pay | Admitting: Internal Medicine

## 2018-11-22 NOTE — Assessment & Plan Note (Signed)
GYN orders mammogram.  Last 12/22/17 - Birads II.

## 2018-11-22 NOTE — Assessment & Plan Note (Signed)
Follow cbc.  

## 2018-11-22 NOTE — Assessment & Plan Note (Signed)
Using hydroxyzine to help with sleep.  Doing well with this medication.  Follow.

## 2018-11-24 DIAGNOSIS — M545 Low back pain: Secondary | ICD-10-CM | POA: Diagnosis not present

## 2018-11-26 ENCOUNTER — Other Ambulatory Visit: Payer: Medicare HMO

## 2018-11-30 ENCOUNTER — Other Ambulatory Visit (INDEPENDENT_AMBULATORY_CARE_PROVIDER_SITE_OTHER): Payer: Medicare HMO

## 2018-11-30 ENCOUNTER — Other Ambulatory Visit: Payer: Self-pay

## 2018-11-30 DIAGNOSIS — D72819 Decreased white blood cell count, unspecified: Secondary | ICD-10-CM

## 2018-11-30 DIAGNOSIS — Z13220 Encounter for screening for lipoid disorders: Secondary | ICD-10-CM | POA: Diagnosis not present

## 2018-11-30 LAB — LIPID PANEL
Cholesterol: 217 mg/dL — ABNORMAL HIGH (ref 0–200)
HDL: 72.6 mg/dL (ref >39.00)
LDL Cholesterol: 131 mg/dL — ABNORMAL HIGH (ref 0–99)
NonHDL: 144.75
Total CHOL/HDL Ratio: 3
Triglycerides: 71 mg/dL (ref 0.0–149.0)
VLDL: 14.2 mg/dL (ref 0.0–40.0)

## 2018-11-30 LAB — COMPREHENSIVE METABOLIC PANEL
ALT: 11 U/L (ref 0–35)
AST: 15 U/L (ref 0–37)
Albumin: 4.1 g/dL (ref 3.5–5.2)
Alkaline Phosphatase: 75 U/L (ref 39–117)
BUN: 13 mg/dL (ref 6–23)
CO2: 29 mEq/L (ref 19–32)
Calcium: 9.1 mg/dL (ref 8.4–10.5)
Chloride: 106 mEq/L (ref 96–112)
Creatinine, Ser: 0.64 mg/dL (ref 0.40–1.20)
GFR: 92.81 mL/min (ref >60.00)
Glucose, Bld: 83 mg/dL (ref 70–99)
Potassium: 3.8 mEq/L (ref 3.5–5.1)
Sodium: 142 mEq/L (ref 135–145)
Total Bilirubin: 0.4 mg/dL (ref 0.2–1.2)
Total Protein: 6.3 g/dL (ref 6.0–8.3)

## 2018-11-30 LAB — CBC WITH DIFFERENTIAL/PLATELET
Basophils Absolute: 0.1 10*3/uL (ref 0.0–0.1)
Basophils Relative: 1.3 % (ref 0.0–3.0)
Eosinophils Absolute: 0.2 10*3/uL (ref 0.0–0.7)
Eosinophils Relative: 5.1 % — ABNORMAL HIGH (ref 0.0–5.0)
HCT: 39.3 % (ref 36.0–46.0)
Hemoglobin: 13.1 g/dL (ref 12.0–15.0)
Lymphocytes Relative: 45.1 % (ref 12.0–46.0)
Lymphs Abs: 1.8 10*3/uL (ref 0.7–4.0)
MCHC: 33.4 g/dL (ref 30.0–36.0)
MCV: 95.1 fl (ref 78.0–100.0)
Monocytes Absolute: 0.4 10*3/uL (ref 0.1–1.0)
Monocytes Relative: 9.5 % (ref 3.0–12.0)
Neutro Abs: 1.6 10*3/uL (ref 1.4–7.7)
Neutrophils Relative %: 39 % — ABNORMAL LOW (ref 43.0–77.0)
Platelets: 223 10*3/uL (ref 150.0–400.0)
RBC: 4.13 Mil/uL (ref 3.87–5.11)
RDW: 12.9 % (ref 11.5–15.5)
WBC: 4.1 10*3/uL (ref 4.0–10.5)

## 2018-11-30 LAB — TSH: TSH: 2.57 u[IU]/mL (ref 0.35–4.50)

## 2018-12-01 ENCOUNTER — Encounter: Payer: Self-pay | Admitting: Internal Medicine

## 2018-12-28 ENCOUNTER — Encounter: Payer: Self-pay | Admitting: Gynecology

## 2018-12-28 DIAGNOSIS — Z96642 Presence of left artificial hip joint: Secondary | ICD-10-CM | POA: Diagnosis not present

## 2018-12-28 DIAGNOSIS — Z803 Family history of malignant neoplasm of breast: Secondary | ICD-10-CM | POA: Diagnosis not present

## 2018-12-28 DIAGNOSIS — Z853 Personal history of malignant neoplasm of breast: Secondary | ICD-10-CM | POA: Diagnosis not present

## 2018-12-28 DIAGNOSIS — M810 Age-related osteoporosis without current pathological fracture: Secondary | ICD-10-CM | POA: Diagnosis not present

## 2018-12-28 DIAGNOSIS — Z1231 Encounter for screening mammogram for malignant neoplasm of breast: Secondary | ICD-10-CM | POA: Diagnosis not present

## 2018-12-30 ENCOUNTER — Encounter: Payer: Self-pay | Admitting: Gynecology

## 2019-01-08 ENCOUNTER — Other Ambulatory Visit: Payer: Self-pay

## 2019-01-11 ENCOUNTER — Encounter: Payer: Self-pay | Admitting: Gynecology

## 2019-01-11 ENCOUNTER — Other Ambulatory Visit: Payer: Self-pay

## 2019-01-11 ENCOUNTER — Ambulatory Visit (INDEPENDENT_AMBULATORY_CARE_PROVIDER_SITE_OTHER): Payer: Medicare HMO | Admitting: Gynecology

## 2019-01-11 VITALS — BP 118/76 | Ht 64.0 in | Wt 119.0 lb

## 2019-01-11 DIAGNOSIS — N393 Stress incontinence (female) (male): Secondary | ICD-10-CM | POA: Diagnosis not present

## 2019-01-11 DIAGNOSIS — Z01419 Encounter for gynecological examination (general) (routine) without abnormal findings: Secondary | ICD-10-CM | POA: Diagnosis not present

## 2019-01-11 DIAGNOSIS — N952 Postmenopausal atrophic vaginitis: Secondary | ICD-10-CM

## 2019-01-11 DIAGNOSIS — Z853 Personal history of malignant neoplasm of breast: Secondary | ICD-10-CM

## 2019-01-11 NOTE — Patient Instructions (Signed)
Follow-up in 1 year for annual exam 

## 2019-01-11 NOTE — Progress Notes (Signed)
    COLLEEN VANLOAN November 06, 1950 [id number]        68 y.o.  [id number] for breast and pelvic exam.  Notes over the last several weeks slight urinary incontinence while walking.  No other times.  No UTI symptoms.  Past medical history,surgical history, problem list, medications, allergies, family history and social history were all reviewed and documented as reviewed in the EPIC chart.  ROS:  Performed with pertinent positives and negatives included in the history, assessment and plan.   Additional significant findings : None   Exam: Caryn Bee assistant Vitals:   01/11/19 1121  BP: 118/76  Weight: 119 lb (54 kg)  Height: 5\' 4"  (1.626 m)   Body mass index is 20.43 kg/m.  General appearance:  Normal affect, orientation and appearance. Skin: Grossly normal HEENT: Without gross lesions.  No cervical or supraclavicular adenopathy. Thyroid normal.  Lungs:  Clear without wheezing, rales or rhonchi Cardiac: RR, without RMG Abdominal:  Soft, nontender, without masses, guarding, rebound, organomegaly or hernia Breasts:  Examined lying and sitting without masses, retractions, discharge or axillary adenopathy. Pelvic:  Ext, BUS, Vagina: With atrophic changes  Cervix: With atrophic changes  Uterus: Anteverted, normal size, shape and contour, midline and mobile nontender   Adnexa: Without masses or tenderness    Anus and perineum: Normal   Rectovaginal: Normal sphincter tone without palpated masses or tenderness.    Assessment/Plan:  68 y.o. [id number] female for breast and pelvic exam  1. Postmenopausal.  No significant menopausal symptoms or any vaginal bleeding. 2. Osteoporosis.  DEXA this month T score -3.1 distal third of radius.  Stable from prior DEXA.  Other values osteopenic.  Options from management reviewed to include medication now based on the distal third of the radius versus waiting and repeating the bone density in 2 years given stability when compared to her prior study.  At this  point the patient would prefer monitoring and we will plan on repeating her bone density in 2 years. 3. Mammography 12/2018.  Continue with annual mammography next year.  History of left-sided breast cancer 2003.  Exam NED. 4. Pap smear 11/2016.  No Pap smear done today.  No history of abnormal Pap smears.  Plan repeat Pap smear next year at 3-year interval. 5. Colonoscopy 2014.  Repeat at their recommended interval. 6. Health maintenance.  No routine lab work done as patient reports is done elsewhere.  Follow-up 1 year, sooner as needed   [name], 11:51 AM 01/11/2019

## 2019-01-12 LAB — URINALYSIS, COMPLETE W/RFL CULTURE
Bacteria, UA: NONE SEEN /HPF
Bilirubin Urine: NEGATIVE
Glucose, UA: NEGATIVE
Hgb urine dipstick: NEGATIVE
Hyaline Cast: NONE SEEN /LPF
Ketones, ur: NEGATIVE
Leukocyte Esterase: NEGATIVE
Nitrites, Initial: NEGATIVE
Protein, ur: NEGATIVE
RBC / HPF: NONE SEEN /HPF (ref 0–2)
Specific Gravity, Urine: 1.005 (ref 1.001–1.03)
Squamous Epithelial / HPF: NONE SEEN /HPF (ref <=5)
WBC, UA: NONE SEEN /HPF (ref 0–5)
pH: 6.5 (ref 5.0–8.0)

## 2019-01-12 LAB — NO CULTURE INDICATED

## 2019-01-20 ENCOUNTER — Encounter: Payer: Self-pay | Admitting: Gynecology

## 2019-01-21 DIAGNOSIS — R69 Illness, unspecified: Secondary | ICD-10-CM | POA: Diagnosis not present

## 2019-02-10 ENCOUNTER — Other Ambulatory Visit: Payer: Self-pay | Admitting: Internal Medicine

## 2019-02-10 MED ORDER — HYDROXYZINE HCL 10 MG PO TABS: ORAL_TABLET | ORAL | 0 refills | Status: DC

## 2019-02-10 NOTE — Telephone Encounter (Signed)
Medication Refill - Medication: hydrOXYzine (ATARAX/VISTARIL) 10 MG tablet   Has the patient contacted their pharmacy? No. (Agent: If no, request that the patient contact the pharmacy for the refill.) (Agent: If yes, when and what did the pharmacy advise?)  Preferred Pharmacy (with phone number or street name):  [place], [place] - [place]  [place] [address]  Phone: [phone] Fax: [phone]  Not a 24 hour pharmacy; exact hours not known.     Agent: Please be advised that RX refills may take up to 3 business days. We ask that you follow-up with your pharmacy.

## 2019-03-30 DIAGNOSIS — Z20828 Contact with and (suspected) exposure to other viral communicable diseases: Secondary | ICD-10-CM | POA: Diagnosis not present

## 2019-03-30 DIAGNOSIS — Z03818 Encounter for observation for suspected exposure to other biological agents ruled out: Secondary | ICD-10-CM | POA: Diagnosis not present

## 2019-03-31 ENCOUNTER — Telehealth: Payer: Self-pay | Admitting: Internal Medicine

## 2019-03-31 DIAGNOSIS — Z0000 Encounter for general adult medical examination without abnormal findings: Secondary | ICD-10-CM

## 2019-03-31 DIAGNOSIS — Z1159 Encounter for screening for other viral diseases: Secondary | ICD-10-CM

## 2019-03-31 NOTE — Telephone Encounter (Signed)
Does not look like she has had hep vaccines in the past.

## 2019-03-31 NOTE — Telephone Encounter (Signed)
Will need to check and see if she has immunity to hepatitis A and B.  If not, yes can get hepatitis vaccine.

## 2019-03-31 NOTE — Telephone Encounter (Signed)
Pt wanted to know if she needed to get any hepatitis vaccines

## 2019-04-01 NOTE — Telephone Encounter (Signed)
I have placed the order for the hepatitis screening.  I also included screening for hepatitis C (is recommended).  Please confirm she is ok for [place] to screen for hepatitis C as well.  If not, will need to notify lab to cancel lab for hepatitis C

## 2019-04-01 NOTE — Telephone Encounter (Signed)
Pt would like to have her labs checked to see if she has immunity to hepatitis A and B. I wasn't sure what needed to be ordered. She is okay to go ahead and come in for labs but also explained that if she is due for other labs, it can all be drawn at once.

## 2019-04-02 NOTE — Telephone Encounter (Signed)
Pt aware and agreeable to hep C screening as well. Scheduled for labs.

## 2019-04-19 ENCOUNTER — Other Ambulatory Visit: Payer: Self-pay

## 2019-04-19 ENCOUNTER — Telehealth: Payer: Self-pay | Admitting: Internal Medicine

## 2019-04-19 MED ORDER — HYDROXYZINE HCL 10 MG PO TABS: ORAL_TABLET | ORAL | 0 refills | Status: DC

## 2019-04-19 NOTE — Telephone Encounter (Signed)
Pt called about needing a refill hydrOXYzine (ATARAX/VISTARIL) 10 MG tablet Please advise and Thank you!  Pharmacy is [facility], [place]

## 2019-04-20 DIAGNOSIS — M25552 Pain in left hip: Secondary | ICD-10-CM | POA: Diagnosis not present

## 2019-04-20 DIAGNOSIS — Z96642 Presence of left artificial hip joint: Secondary | ICD-10-CM | POA: Diagnosis not present

## 2019-04-20 DIAGNOSIS — Z09 Encounter for follow-up examination after completed treatment for conditions other than malignant neoplasm: Secondary | ICD-10-CM | POA: Diagnosis not present

## 2019-04-20 NOTE — Telephone Encounter (Signed)
Rx refilled.

## 2019-04-21 ENCOUNTER — Other Ambulatory Visit (INDEPENDENT_AMBULATORY_CARE_PROVIDER_SITE_OTHER): Payer: Medicare HMO

## 2019-04-21 ENCOUNTER — Other Ambulatory Visit: Payer: Self-pay

## 2019-04-21 DIAGNOSIS — Z1159 Encounter for screening for other viral diseases: Secondary | ICD-10-CM | POA: Diagnosis not present

## 2019-04-22 ENCOUNTER — Encounter: Payer: Self-pay | Admitting: Internal Medicine

## 2019-04-22 DIAGNOSIS — H25813 Combined forms of age-related cataract, bilateral: Secondary | ICD-10-CM | POA: Diagnosis not present

## 2019-04-22 DIAGNOSIS — H16223 Keratoconjunctivitis sicca, not specified as Sjogren's, bilateral: Secondary | ICD-10-CM | POA: Diagnosis not present

## 2019-04-22 DIAGNOSIS — H35371 Puckering of macula, right eye: Secondary | ICD-10-CM | POA: Diagnosis not present

## 2019-04-22 LAB — HEPATITIS C ANTIBODY
Hepatitis C Ab: NONREACTIVE
SIGNAL TO CUT-OFF: 0.01 (ref <1.00)

## 2019-04-22 LAB — HEPATITIS A ANTIBODY, TOTAL: Hepatitis A AB,Total: NONREACTIVE

## 2019-04-22 LAB — HEPATITIS B CORE ANTIBODY, TOTAL: Hep B Core Total Ab: NONREACTIVE

## 2019-04-22 LAB — HEPATITIS B SURFACE ANTIBODY,QUALITATIVE: Hep B S Ab: NONREACTIVE

## 2019-04-22 LAB — HEPATITIS B SURFACE ANTIGEN: Hepatitis B Surface Ag: NONREACTIVE

## 2019-04-23 ENCOUNTER — Encounter: Payer: Self-pay | Admitting: Internal Medicine

## 2019-05-11 DIAGNOSIS — R69 Illness, unspecified: Secondary | ICD-10-CM | POA: Diagnosis not present

## 2019-05-13 ENCOUNTER — Ambulatory Visit: Payer: Medicare HMO

## 2019-05-22 ENCOUNTER — Ambulatory Visit: Payer: Medicare HMO

## 2019-05-25 ENCOUNTER — Ambulatory Visit: Payer: Medicare HMO

## 2019-05-27 ENCOUNTER — Ambulatory Visit: Payer: Medicare HMO

## 2019-06-03 ENCOUNTER — Ambulatory Visit: Payer: Medicare HMO

## 2019-06-03 ENCOUNTER — Telehealth: Payer: Self-pay | Admitting: Internal Medicine

## 2019-06-03 NOTE — Telephone Encounter (Signed)
Left message for patient to call back and schedule Medicare Annual Wellness Visit (AWV) either virtually or audio only.  No hx of AWV; please schedule at anytime with Denisa O'Brien-Blaney at [facility] in 2019

## 2019-06-23 ENCOUNTER — Telehealth: Payer: Self-pay | Admitting: Internal Medicine

## 2019-06-23 MED ORDER — HYDROXYZINE HCL 10 MG PO TABS: ORAL_TABLET | ORAL | 0 refills | Status: DC

## 2019-06-23 NOTE — Telephone Encounter (Signed)
Pt needs a refill on hydrOXYzine (ATARAX/VISTARIL) 10 MG tablet

## 2019-08-02 ENCOUNTER — Encounter: Payer: Self-pay | Admitting: Internal Medicine

## 2019-08-02 NOTE — Telephone Encounter (Signed)
She would not need another Tdap.  It would just be a tetanus booster.

## 2019-08-06 NOTE — Telephone Encounter (Signed)
Patient aware.

## 2019-08-20 ENCOUNTER — Telehealth: Payer: Self-pay | Admitting: Internal Medicine

## 2019-08-20 MED ORDER — HYDROXYZINE HCL 10 MG PO TABS: ORAL_TABLET | ORAL | 0 refills | Status: DC

## 2019-08-20 NOTE — Telephone Encounter (Signed)
Patient needs a refill on her hydrOXYzine (ATARAX/VISTARIL) 10 MG tablet. Pt has 4 remaining.

## 2019-10-11 ENCOUNTER — Telehealth: Payer: Self-pay | Admitting: Internal Medicine

## 2019-10-11 MED ORDER — HYDROXYZINE HCL 10 MG PO TABS: ORAL_TABLET | ORAL | 0 refills | Status: DC

## 2019-10-11 NOTE — Telephone Encounter (Signed)
Patient needs a refill on her hydrOXYzine (ATARAX/VISTARIL) 10 MG tablet.

## 2019-10-11 NOTE — Telephone Encounter (Signed)
Medication sent in to preferred pharmacy per protocol.   

## 2019-11-09 DIAGNOSIS — R69 Illness, unspecified: Secondary | ICD-10-CM | POA: Diagnosis not present

## 2019-11-18 ENCOUNTER — Encounter: Payer: Medicare HMO | Admitting: Internal Medicine

## 2019-11-22 ENCOUNTER — Telehealth: Payer: Self-pay | Admitting: Internal Medicine

## 2019-11-22 MED ORDER — HYDROXYZINE HCL 10 MG PO TABS: ORAL_TABLET | ORAL | 1 refills | Status: DC

## 2019-11-22 NOTE — Telephone Encounter (Signed)
Pt would like a refill of hydrOXYzine (ATARAX/VISTARIL) 10 MG tablet

## 2019-11-24 ENCOUNTER — Encounter: Payer: Self-pay | Admitting: Genetic Counselor

## 2019-12-14 ENCOUNTER — Encounter: Payer: Medicare HMO | Admitting: Internal Medicine

## 2020-01-03 ENCOUNTER — Encounter: Payer: Self-pay | Admitting: Obstetrics and Gynecology

## 2020-01-03 DIAGNOSIS — Z1231 Encounter for screening mammogram for malignant neoplasm of breast: Secondary | ICD-10-CM | POA: Diagnosis not present

## 2020-01-03 DIAGNOSIS — Z803 Family history of malignant neoplasm of breast: Secondary | ICD-10-CM | POA: Diagnosis not present

## 2020-01-04 DIAGNOSIS — R69 Illness, unspecified: Secondary | ICD-10-CM | POA: Diagnosis not present

## 2020-01-12 ENCOUNTER — Encounter: Payer: Medicare HMO | Admitting: Obstetrics and Gynecology

## 2020-01-17 DIAGNOSIS — H1132 Conjunctival hemorrhage, left eye: Secondary | ICD-10-CM | POA: Diagnosis not present

## 2020-01-24 DIAGNOSIS — S0500XA Injury of conjunctiva and corneal abrasion without foreign body, unspecified eye, initial encounter: Secondary | ICD-10-CM | POA: Diagnosis not present

## 2020-01-25 ENCOUNTER — Other Ambulatory Visit: Payer: Self-pay

## 2020-01-25 ENCOUNTER — Ambulatory Visit: Payer: Medicare HMO | Admitting: Obstetrics and Gynecology

## 2020-01-25 ENCOUNTER — Encounter: Payer: Self-pay | Admitting: Obstetrics and Gynecology

## 2020-01-25 VITALS — BP 118/76 | Ht 64.0 in | Wt 112.0 lb

## 2020-01-25 DIAGNOSIS — Z853 Personal history of malignant neoplasm of breast: Secondary | ICD-10-CM | POA: Diagnosis not present

## 2020-01-25 DIAGNOSIS — M810 Age-related osteoporosis without current pathological fracture: Secondary | ICD-10-CM | POA: Diagnosis not present

## 2020-01-25 DIAGNOSIS — Z01419 Encounter for gynecological examination (general) (routine) without abnormal findings: Secondary | ICD-10-CM

## 2020-01-25 NOTE — Progress Notes (Signed)
   Tanya Fox 18-Apr-1950 [id number]  SUBJECTIVE:  69 y.o. G2P2002 female here for a breast and pelvic exam. She has no gynecologic concerns.  Current Outpatient Medications  Medication Sig Dispense Refill  . Cholecalciferol (VITAMIN D PO) Take by mouth.    . hydrOXYzine (ATARAX/VISTARIL) 10 MG tablet Take 1-2 tablets q hs prn 180 tablet 1  . Multiple Vitamin (MULTIVITAMIN) tablet Take 1 tablet by mouth daily.       No current facility-administered medications for this visit.   Allergies: Patient has no known allergies.  No LMP recorded. Patient is postmenopausal.  Past medical history,surgical history, problem list, medications, allergies, family history and social history were all reviewed and documented as reviewed in the EPIC chart.  GYN ROS: no abnormal bleeding, pelvic pain or discharge, no breast pain or new or enlarging lumps on self exam.  No dysuria, urinary frequency, pain with urination, cloudy/malodorous urine.   OBJECTIVE:  BP 118/76   Ht 5\' 4"  (1.626 m)   Wt 112 lb (50.8 kg)   BMI 19.22 kg/m  The patient appears well, alert, oriented, in no distress.  BREAST EXAM: breasts appear normal, no suspicious masses, no skin or nipple changes or axillary nodes  PELVIC EXAM: VULVA: normal appearing vulva with atrophic change, no masses, tenderness or lesions, VAGINA: normal appearing vagina with atrophic change, normal color and discharge, no lesions, CERVIX: normal appearing atrophic cervix without discharge or lesions, UTERUS: uterus is normal size, shape, consistency and nontender, ADNEXA: normal adnexa in size, nontender and no masses  Chaperone: Caryn Bee present during the examination  ASSESSMENT:  69 y.o. [id number] here for a breast and pelvic exam  PLAN:   1. Postmenopausal.  Significant menopausal symptoms.  No hot flashes or night sweats.  No vaginal bleeding. 2. Pap smear 11/2016.  No history of abnormal Pap smears.  We discussed based on the current  screening guidelines and her normal Pap smear history and the age criteria she does not need to continue with Pap smear screening and we are both comfortable with stopping. 3.  History of left breast cancer in 2003.  Mammogram 12/2019.  Normal breast exam today/NED.  Continue with annual mammograms. 4. Colonoscopy 2014.  She will follow up at the interval recommended by her GI specialist.   5.  Osteoporosis.  DEXA 12/2018.  T score -3.1 in the distal third of radius.  This was stable from the prior DEXA, other values were osteopenic range.  Last year she had a discussion with Dr. Loetta Rough regarding treatment versus continued surveillance and she prefer the latter.  I encouraged her to continue with vitamin D supplementation she does take, she is not taking any calcium supplement, so we discussed dietary sources and combination with supplements sources to get to 1200 mg daily.  Encouraged light weight resistance involving arms.  We will plan on DEXA next year, she can call us next year to have this order since she usually gets this done at [facility] with her mammogram. 6. Health maintenance.  No labs today as she normally has these completed elsewhere.  Return annually or sooner, prn.  Joseph Pierini MD 01/25/20

## 2020-01-31 ENCOUNTER — Telehealth: Payer: Self-pay

## 2020-01-31 DIAGNOSIS — D72819 Decreased white blood cell count, unspecified: Secondary | ICD-10-CM

## 2020-01-31 DIAGNOSIS — Z13220 Encounter for screening for lipoid disorders: Secondary | ICD-10-CM

## 2020-01-31 NOTE — Telephone Encounter (Signed)
Pt wants to get labs ordered and scheduled before her appt in November. Pt is going out of town following appt.

## 2020-02-01 NOTE — Telephone Encounter (Signed)
Pt is following up on whether she can have lab appointment before physical.

## 2020-02-02 ENCOUNTER — Telehealth: Payer: Self-pay | Admitting: Internal Medicine

## 2020-02-02 NOTE — Addendum Note (Signed)
Addended by: Lars Masson on: 02/02/2020 01:34 PM   Modules accepted: Orders

## 2020-02-02 NOTE — Telephone Encounter (Signed)
Labs ordered. Please schedule her for fasting labs.

## 2020-02-02 NOTE — Telephone Encounter (Signed)
PT SCHEDULED

## 2020-02-02 NOTE — Telephone Encounter (Signed)
Left message for patient to call back and schedule Medicare Annual Wellness Visit (AWV)  ° °This should be a telephone visit only=30 minutes. ° °No hx of AWV; please schedule at anytime with Denisa O'Brien-Blaney at [place] ° ° °

## 2020-02-15 ENCOUNTER — Other Ambulatory Visit: Payer: Self-pay

## 2020-02-15 ENCOUNTER — Other Ambulatory Visit (INDEPENDENT_AMBULATORY_CARE_PROVIDER_SITE_OTHER): Payer: Medicare HMO

## 2020-02-15 DIAGNOSIS — D72819 Decreased white blood cell count, unspecified: Secondary | ICD-10-CM | POA: Diagnosis not present

## 2020-02-15 DIAGNOSIS — Z13220 Encounter for screening for lipoid disorders: Secondary | ICD-10-CM | POA: Diagnosis not present

## 2020-02-15 LAB — HEPATIC FUNCTION PANEL
ALT: 21 U/L (ref 0–35)
AST: 27 U/L (ref 0–37)
Albumin: 4.2 g/dL (ref 3.5–5.2)
Alkaline Phosphatase: 78 U/L (ref 39–117)
Bilirubin, Direct: 0.1 mg/dL (ref 0.0–0.3)
Total Bilirubin: 0.5 mg/dL (ref 0.2–1.2)
Total Protein: 7 g/dL (ref 6.0–8.3)

## 2020-02-15 LAB — BASIC METABOLIC PANEL
BUN: 24 mg/dL — ABNORMAL HIGH (ref 6–23)
CO2: 29 mEq/L (ref 19–32)
Calcium: 9.5 mg/dL (ref 8.4–10.5)
Chloride: 103 mEq/L (ref 96–112)
Creatinine, Ser: 0.6 mg/dL (ref 0.40–1.20)
GFR: 92.95 mL/min (ref >60.00)
Glucose, Bld: 70 mg/dL (ref 70–99)
Potassium: 3.9 mEq/L (ref 3.5–5.1)
Sodium: 139 mEq/L (ref 135–145)

## 2020-02-15 LAB — LIPID PANEL
Cholesterol: 170 mg/dL (ref 0–200)
HDL: 73.2 mg/dL (ref >39.00)
LDL Cholesterol: 86 mg/dL (ref 0–99)
NonHDL: 96.76
Total CHOL/HDL Ratio: 2
Triglycerides: 56 mg/dL (ref 0.0–149.0)
VLDL: 11.2 mg/dL (ref 0.0–40.0)

## 2020-02-15 LAB — CBC WITH DIFFERENTIAL/PLATELET
Basophils Absolute: 0 10*3/uL (ref 0.0–0.1)
Basophils Relative: 1.1 % (ref 0.0–3.0)
Eosinophils Absolute: 0.3 10*3/uL (ref 0.0–0.7)
Eosinophils Relative: 5.7 % — ABNORMAL HIGH (ref 0.0–5.0)
HCT: 41.4 % (ref 36.0–46.0)
Hemoglobin: 13.9 g/dL (ref 12.0–15.0)
Lymphocytes Relative: 48.9 % — ABNORMAL HIGH (ref 12.0–46.0)
Lymphs Abs: 2.2 10*3/uL (ref 0.7–4.0)
MCHC: 33.7 g/dL (ref 30.0–36.0)
MCV: 93.5 fl (ref 78.0–100.0)
Monocytes Absolute: 0.5 10*3/uL (ref 0.1–1.0)
Monocytes Relative: 12 % (ref 3.0–12.0)
Neutro Abs: 1.5 10*3/uL (ref 1.4–7.7)
Neutrophils Relative %: 32.3 % — ABNORMAL LOW (ref 43.0–77.0)
Platelets: 216 10*3/uL (ref 150.0–400.0)
RBC: 4.43 Mil/uL (ref 3.87–5.11)
RDW: 13.1 % (ref 11.5–15.5)
WBC: 4.5 10*3/uL (ref 4.0–10.5)

## 2020-02-15 LAB — TSH: TSH: 3.24 u[IU]/mL (ref 0.35–4.50)

## 2020-02-16 ENCOUNTER — Other Ambulatory Visit: Payer: Self-pay | Admitting: Internal Medicine

## 2020-02-18 ENCOUNTER — Ambulatory Visit (INDEPENDENT_AMBULATORY_CARE_PROVIDER_SITE_OTHER): Payer: Medicare HMO | Admitting: Internal Medicine

## 2020-02-18 ENCOUNTER — Other Ambulatory Visit: Payer: Self-pay

## 2020-02-18 ENCOUNTER — Encounter: Payer: Self-pay | Admitting: Internal Medicine

## 2020-02-18 VITALS — BP 104/70 | HR 71 | Temp 97.7°F | Resp 16 | Ht 64.0 in | Wt 110.0 lb

## 2020-02-18 DIAGNOSIS — D72819 Decreased white blood cell count, unspecified: Secondary | ICD-10-CM

## 2020-02-18 DIAGNOSIS — Z0000 Encounter for general adult medical examination without abnormal findings: Secondary | ICD-10-CM

## 2020-02-18 DIAGNOSIS — M810 Age-related osteoporosis without current pathological fracture: Secondary | ICD-10-CM

## 2020-02-18 DIAGNOSIS — Z23 Encounter for immunization: Secondary | ICD-10-CM | POA: Diagnosis not present

## 2020-02-18 DIAGNOSIS — Z853 Personal history of malignant neoplasm of breast: Secondary | ICD-10-CM | POA: Diagnosis not present

## 2020-02-18 MED ORDER — ALENDRONATE SODIUM 70 MG PO TABS: 70.0000 mg | ORAL_TABLET | ORAL | 11 refills | Status: DC

## 2020-02-18 NOTE — Progress Notes (Signed)
Patient ID: Tanya Fox, female   DOB: 1950/11/11, 69 y.o.   MRN: [id number]   Subjective:    Patient ID: Tanya Fox, female    DOB: 09/23/1950, 69 y.o.   MRN: [id number]  HPI This visit occurred during the SARS-CoV-2 public health emergency.  Safety protocols were in place, including screening questions prior to the visit, additional usage of staff PPE, and extensive cleaning of exam room while observing appropriate contact time as indicated for disinfecting solutions.  Patient here for her physical exam.  She is doing well.  Feels good.  Stays active.  Has not been exercising as much recently - caring for her mother.  Increased stress related to this, but overall she feels she is handling things well. No chest pain or sob.  No acid reflux.  No abdominal pain.  Bowels moving.  Discussed bone density.  Discussed treatment options.    Past Medical History:  Diagnosis Date  . Atrophic vaginitis   . BRCA negative   . Breast cancer ([place]) 2003   Left side, Stage I  . Collar bone fracture   . Osteoporosis 12/2018   T score -3.1   Past Surgical History:  Procedure Laterality Date  . BREAST SURGERY  2003-2004   Lumpectomy-Lymph nodes-Port a cath-Radiation and Chemo  . CESAREAN SECTION     X 2  . TOTAL HIP ARTHROPLASTY     Left  . TUBAL LIGATION     Family History  Problem Relation Age of Onset  . Hypertension Mother   . Breast cancer Mother        Age 67  . Heart disease Father   . Alcohol abuse Father   . Breast cancer Sister        Age 16  . Cancer Maternal Grandfather        Colon cancer  . Heart disease Maternal Grandfather   . Diabetes Paternal Grandmother    Social History   Socioeconomic History  . Marital status: Married    Spouse name: Not on file  . Number of children: 2  . Years of education: Not on file  . Highest education level: Not on file  Occupational History    Employer: [employer]  Tobacco Use  . Smoking status: Never Smoker  .  Smokeless tobacco: Never Used  Vaping Use  . Vaping Use: Never used  Substance and Sexual Activity  . Alcohol use: Yes    Alcohol/week: 0.0 standard drinks    Comment: very rare  . Drug use: No  . Sexual activity: Yes    Birth control/protection: Surgical    Comment: BTL-1st intercourse 89 yo-1 partner  Other Topics Concern  . Not on file  Social History Narrative  . Not on file   Social Determinants of Health   Financial Resource Strain:   . Difficulty of Paying Living Expenses: Not on file  Food Insecurity:   . Worried About Charity fundraiser in the Last Year: Not on file  . Ran Out of Food in the Last Year: Not on file  Transportation Needs:   . Lack of Transportation (Medical): Not on file  . Lack of Transportation (Non-Medical): Not on file  Physical Activity:   . Days of Exercise per Week: Not on file  . Minutes of Exercise per Session: Not on file  Stress:   . Feeling of Stress : Not on file  Social Connections:   . Frequency of Communication with Friends and Family: Not  on file  . Frequency of Social Gatherings with Friends and Family: Not on file  . Attends Religious Services: Not on file  . Active Member of Clubs or Organizations: Not on file  . Attends Archivist Meetings: Not on file  . Marital Status: Not on file    Outpatient Encounter Medications as of 02/18/2020  Medication Sig  . alendronate (FOSAMAX) 70 MG tablet Take 1 tablet (70 mg total) by mouth every 7 (seven) days. Take with a full glass of water on an empty stomach.  . Cholecalciferol (VITAMIN D PO) Take by mouth.  . hydrOXYzine (ATARAX/VISTARIL) 10 MG tablet TAKE 1 TO 2 TABLETS BY MOUTH AT BEDTIME AS NEEDED  . Multiple Vitamin (MULTIVITAMIN) tablet Take 1 tablet by mouth daily.     No facility-administered encounter medications on file as of 02/18/2020.    Review of Systems  Constitutional: Negative for appetite change and unexpected weight change.  HENT: Negative for congestion,  sinus pressure and sore throat.   Eyes: Negative for pain and visual disturbance.  Respiratory: Negative for cough, chest tightness and shortness of breath.   Cardiovascular: Negative for chest pain, palpitations and leg swelling.  Gastrointestinal: Negative for abdominal pain, diarrhea, nausea and vomiting.  Genitourinary: Negative for difficulty urinating and dysuria.  Musculoskeletal: Negative for joint swelling and myalgias.  Skin: Negative for color change and rash.  Neurological: Negative for dizziness, light-headedness and headaches.  Hematological: Negative for adenopathy. Does not bruise/bleed easily.  Psychiatric/Behavioral: Negative for agitation and dysphoric mood.       Objective:    Physical Exam Vitals reviewed.  Constitutional:      General: She is not in acute distress.    Appearance: Normal appearance.  HENT:     Head: Normocephalic and atraumatic.     Right Ear: External ear normal.     Left Ear: External ear normal.  Eyes:     General: No scleral icterus.       Right eye: No discharge.        Left eye: No discharge.     Conjunctiva/sclera: Conjunctivae normal.  Neck:     Thyroid: No thyromegaly.  Cardiovascular:     Rate and Rhythm: Normal rate and regular rhythm.  Pulmonary:     Effort: No respiratory distress.     Breath sounds: Normal breath sounds. No wheezing.     Comments: Breasts: performed by gyn.  Abdominal:     General: Bowel sounds are normal.     Palpations: Abdomen is soft.     Tenderness: There is no abdominal tenderness.  Genitourinary:    Comments: Performed by gyn.  Musculoskeletal:        General: No swelling or tenderness.     Cervical back: Neck supple. No tenderness.  Lymphadenopathy:     Cervical: No cervical adenopathy.  Skin:    Findings: No erythema or rash.  Neurological:     Mental Status: She is alert.  Psychiatric:        Mood and Affect: Mood normal.        Behavior: Behavior normal.     BP 104/70   Pulse 71    Temp 97.7 F (36.5 C) (Oral)   Resp 16   Ht 5' 4"  (1.626 m)   Wt 110 lb (49.9 kg)   SpO2 98%   BMI 18.88 kg/m  Wt Readings from Last 3 Encounters:  02/18/20 110 lb (49.9 kg)  01/25/20 112 lb (50.8 kg)  01/11/19 119  lb (54 kg)     Lab Results  Component Value Date   WBC 4.5 02/15/2020   HGB 13.9 02/15/2020   HCT 41.4 02/15/2020   PLT 216.0 02/15/2020   GLUCOSE 70 02/15/2020   CHOL 170 02/15/2020   TRIG 56.0 02/15/2020   HDL 73.20 02/15/2020   LDLCALC 86 02/15/2020   ALT 21 02/15/2020   AST 27 02/15/2020   NA 139 02/15/2020   K 3.9 02/15/2020   CL 103 02/15/2020   CREATININE 0.60 02/15/2020   BUN 24 (H) 02/15/2020   CO2 29 02/15/2020   TSH 3.24 02/15/2020   INR 1.2 (H) 03/28/2016       Assessment & Plan:   Problem List Items Addressed This Visit    Osteoporosis of forearm    Discussed bone density results.  Discussed treatment options.  Discussed starting oral bisphosphonates.  She is agreeable.  Start fosamax.  Discussed possible side effects of medication.  Continue calcium and vitamin D and weight bearing exercise.  Follow.        Relevant Medications   alendronate (FOSAMAX) 70 MG tablet   Leukopenia    White blood cell count on recent check wnl.  Follow cbc.       History of breast cancer    Followed by gyn. Per note, 12/2019 mammogram ok.        Health care maintenance    Breast, pelvic and pap smear through gyn. Mammogram per report - 12/2019 - ok.  Colonoscopy 07/2012.         Other Visit Diagnoses    Routine general medical examination at a health care facility    -  Primary   Need for 23-polyvalent pneumococcal polysaccharide vaccine       Relevant Orders   Pneumococcal polysaccharide vaccine 23-valent greater than or equal to 2yo subcutaneous/IM (Completed)       Einar Pheasant, MD

## 2020-02-18 NOTE — Assessment & Plan Note (Signed)
Breast, pelvic and pap smear through gyn. Mammogram per report - 12/2019 - ok.  Colonoscopy 07/2012.

## 2020-02-19 ENCOUNTER — Encounter: Payer: Self-pay | Admitting: Internal Medicine

## 2020-02-19 ENCOUNTER — Telehealth: Payer: Self-pay | Admitting: Internal Medicine

## 2020-02-19 NOTE — Telephone Encounter (Signed)
Pt reported had flu vaccine.  Need date she had entered in chart.  Thanks

## 2020-02-19 NOTE — Assessment & Plan Note (Signed)
Followed by gyn. Per note, 12/2019 mammogram ok.

## 2020-02-19 NOTE — Assessment & Plan Note (Signed)
White blood cell count on recent check wnl.  Follow cbc.

## 2020-02-19 NOTE — Assessment & Plan Note (Signed)
Discussed bone density results.  Discussed treatment options.  Discussed starting oral bisphosphonates.  She is agreeable.  Start fosamax.  Discussed possible side effects of medication.  Continue calcium and vitamin D and weight bearing exercise.  Follow.

## 2020-02-21 NOTE — Telephone Encounter (Signed)
Noted in chart.

## 2020-03-28 DIAGNOSIS — Z03818 Encounter for observation for suspected exposure to other biological agents ruled out: Secondary | ICD-10-CM | POA: Diagnosis not present

## 2020-04-25 DIAGNOSIS — H16223 Keratoconjunctivitis sicca, not specified as Sjogren's, bilateral: Secondary | ICD-10-CM | POA: Diagnosis not present

## 2020-04-25 DIAGNOSIS — H25813 Combined forms of age-related cataract, bilateral: Secondary | ICD-10-CM | POA: Diagnosis not present

## 2020-04-25 DIAGNOSIS — H35371 Puckering of macula, right eye: Secondary | ICD-10-CM | POA: Diagnosis not present

## 2020-05-25 ENCOUNTER — Telehealth: Payer: Self-pay | Admitting: Internal Medicine

## 2020-05-25 MED ORDER — HYDROXYZINE HCL 10 MG PO TABS
ORAL_TABLET | ORAL | 1 refills | Status: DC
Start: 2020-05-25 — End: 2020-11-14

## 2020-05-25 NOTE — Telephone Encounter (Signed)
Pt is needing refill on  hydrOXYzine (ATARAX/VISTARIL) 10 MG tablet  [facility] - [place], [place] - [address], [place] [address]  Phone:  [phone] Fax:  [phone]    Please advise

## 2020-05-30 ENCOUNTER — Ambulatory Visit (INDEPENDENT_AMBULATORY_CARE_PROVIDER_SITE_OTHER): Payer: Medicare HMO

## 2020-05-30 VITALS — Ht 64.0 in | Wt 110.0 lb

## 2020-05-30 DIAGNOSIS — Z0000 Encounter for general adult medical examination without abnormal findings: Secondary | ICD-10-CM

## 2020-05-30 NOTE — Progress Notes (Signed)
Subjective:   Tanya Fox is a 70 y.o. female who presents for an Initial Medicare Annual Wellness Visit.  Review of Systems    No ROS.  Medicare Wellness Virtual Visit.     Cardiac Risk Factors include: advanced age (>67mn, >>74women)     Objective:    Today's Vitals   05/30/20 1316  Weight: 110 lb (49.9 kg)  Height: 5' 4" (1.626 m)   Body mass index is 18.88 kg/m.  Advanced Directives 05/30/2020  Does Patient Have a Medical Advance Directive? Yes  Type of AParamedicof ADeanLiving will  Does patient want to make changes to medical advance directive? No - Patient declined  Copy of HDuartein Chart? No - copy requested    Current Medications (verified) Outpatient Encounter Medications as of 05/30/2020  Medication Sig  . alendronate (FOSAMAX) 70 MG tablet Take 1 tablet (70 mg total) by mouth every 7 (seven) days. Take with a full glass of water on an empty stomach.  . Cholecalciferol (VITAMIN D PO) Take by mouth.  . hydrOXYzine (ATARAX/VISTARIL) 10 MG tablet TAKE 1 TO 2 TABLETS BY MOUTH AT BEDTIME AS NEEDED  . Multiple Vitamin (MULTIVITAMIN) tablet Take 1 tablet by mouth daily.     No facility-administered encounter medications on file as of 05/30/2020.    Allergies (verified) Patient has no known allergies.   History: Past Medical History:  Diagnosis Date  . Atrophic vaginitis   . BRCA negative   . [place]   Left side, Stage I  . Collar bone fracture   . Osteoporosis 12/2018   T score -3.1   Past Surgical History:  Procedure Laterality Date  . BREAST SURGERY  2003-2004   Lumpectomy-Lymph nodes-Port a cath-Radiation and Chemo  . CESAREAN SECTION     X 2  . TOTAL HIP ARTHROPLASTY     Left  . TUBAL LIGATION     Family History  Problem Relation Age of Onset  . Hypertension Mother   . Breast cancer Mother        Age 528 . Heart disease Father   . Alcohol abuse Father   . Breast  cancer Sister        Age 68 . Cancer Maternal Grandfather        Colon cancer  . Heart disease Maternal Grandfather   . Diabetes Paternal Grandmother    Social History   Socioeconomic History  . Marital status: Married    Spouse name: Not on file  . Number of children: 2  . Years of education: Not on file  . Highest education level: Not on file  Occupational History    Employer: [employer]  Tobacco Use  . Smoking status: Never Smoker  . Smokeless tobacco: Never Used  Vaping Use  . Vaping Use: Never used  Substance and Sexual Activity  . Alcohol use: Yes    Alcohol/week: 0.0 standard drinks    Comment: very rare  . Drug use: No  . Sexual activity: Yes    Birth control/protection: Surgical    Comment: BTL-1st intercourse 89yo-1 partner  Other Topics Concern  . Not on file  Social History Narrative  . Not on file   Social Determinants of Health   Financial Resource Strain: Low Risk   . Difficulty of Paying Living Expenses: Not hard at all  Food Insecurity: No Food Insecurity  . Worried About RCharity fundraiserin  the Last Year: Never true  . Ran Out of Food in the Last Year: Never true  Transportation Needs: No Transportation Needs  . Lack of Transportation (Medical): No  . Lack of Transportation (Non-Medical): No  Physical Activity: Sufficiently Active  . Days of Exercise per Week: 5 days  . Minutes of Exercise per Session: 30 min  Stress: No Stress Concern Present  . Feeling of Stress : Not at all  Social Connections: Unknown  . Frequency of Communication with Friends and Family: More than three times a week  . Frequency of Social Gatherings with Friends and Family: More than three times a week  . Attends Religious Services: Not on file  . Active Member of Clubs or Organizations: Not on file  . Attends Archivist Meetings: Not on file  . Marital Status: Not on file    Tobacco Counseling Counseling given: Not Answered   Clinical  Intake:  Pre-visit preparation completed: Yes        Diabetes: No  How often do you need to have someone help you when you read instructions, pamphlets, or other written materials from your doctor or pharmacy?: 1 - Never   Interpreter Needed?: No      Activities of Daily Living In your present state of health, do you have any difficulty performing the following activities: 05/30/2020  Hearing? N  Vision? N  Difficulty concentrating or making decisions? N  Walking or climbing stairs? N  Dressing or bathing? N  Doing errands, shopping? N  Preparing Food and eating ? N  Using the Toilet? N  In the past six months, have you accidently leaked urine? N  Do you have problems with loss of bowel control? N  Managing your Medications? N  Managing your Finances? N  Housekeeping or managing your Housekeeping? N  Some recent data might be hidden    Patient Care Team: Einar Pheasant, MD as PCP - General (Unknown Physician Specialty)  Indicate any recent Medical Services you may have received from other than [facility] providers in the past year (date may be approximate).     Assessment:   This is a routine wellness examination for Tanya Fox.  I connected with Tanya Fox today by telephone and verified that I am speaking with the correct person using two identifiers. Location patient: home Location provider: work Persons participating in the virtual visit: patient, Marine scientist.    I discussed the limitations, risks, security and privacy concerns of performing an evaluation and management service by telephone and the availability of in person appointments. The patient expressed understanding and verbally consented to this telephonic visit.    Interactive audio and video telecommunications were attempted between this provider and patient, however failed, due to patient having technical difficulties OR patient did not have access to video capability.  We continued and completed visit with audio  only.  Some vital signs may be absent or patient reported.   Hearing/Vision screen  Hearing Screening   125Hz 250Hz 500Hz 1000Hz 2000Hz 3000Hz 4000Hz 6000Hz 8000Hz  Right ear:           Left ear:           Comments: Patient is able to hear conversational tones without difficulty.  No issues reported.  Vision Screening Comments: Wears corrective lenses Visual acuity not assessed, virtual visit.  They have seen their ophthalmologist in the last 12 months.     Dietary issues and exercise activities discussed: Current Exercise Habits: Home exercise routine, Type of exercise:  walking, Time (Minutes): 30, Frequency (Times/Week): 5, Weekly Exercise (Minutes/Week): 150, Intensity: Mild  Goals    . Follow up with Primary Care Provider     As needed      Depression Screen PHQ 2/9 Scores 05/30/2020 10/14/2017 02/27/2016 08/23/2015  PHQ - 2 Score 0 0 0 0    Fall Risk Fall Risk  05/30/2020 02/18/2020 02/27/2016 08/23/2015  Falls in the past year? 0 0 No No  Number falls in past yr: 0 - - -  Injury with Fall? 0 - - -  Follow up Falls evaluation completed Falls evaluation completed - -    FALL RISK PREVENTION PERTAINING TO THE HOME: Handrails in use when climbing stairs?Yes Home free of loose throw rugs in walkways, pet beds, electrical cords, etc? Yes  Adequate lighting in your home to reduce risk of falls? Yes   ASSISTIVE DEVICES UTILIZED TO PREVENT FALLS: Life alert? No  Use of a cane, walker or w/c? No   TIMED UP AND GO: Was the test performed? No . Virtual visit.    Cognitive Function:  Patient is alert and oriented x3. Denies difficulty focusing, making decisions, memory loss.  MMSE/6CIT deferred. Normal by direct communication/observation.     Immunizations Immunization History  Administered Date(s) Administered  . Influenza Split 02/01/2014  . Influenza, High Dose Seasonal PF 02/03/2018  . Influenza,inj,Quad PF,6+ Mos 01/15/2017  . Influenza-Unspecified 01/31/2014,  01/04/2020  . PPD Test 03/16/2012, 03/09/2013, 03/14/2014  . Pneumococcal Conjugate-13 11/17/2018  . Pneumococcal Polysaccharide-23 02/18/2020  . Tdap 01/01/2011, 12/22/2019  . Zoster Recombinat (Shingrix) 11/18/2018, 01/27/2019     Health Maintenance There are no preventive care reminders to display for this patient.  Health Maintenance  Topic Date Due  . COVID-19 Vaccine (1) 02/20/2021 (Originally 10/23/1962)  . PAP SMEAR-Modifier  11/22/2020  . MAMMOGRAM  01/02/2022  . COLONOSCOPY (Pts 45-26yr Insurance coverage will need to be confirmed)  07/31/2022  . TETANUS/TDAP  12/21/2029  . INFLUENZA VACCINE  Completed  . DEXA SCAN  Completed  . Hepatitis C Screening  Completed  . PNA vac Low Risk Adult  Completed   Colorectal cancer screening: Type of screening: Colonoscopy. Completed 4//17/14. Repeat every 10 years  Mammogram status: Completed 01/03/20. Repeat every year  Bone density- every other year.   Lung Cancer Screening: (Low Dose CT Chest recommended if Age 68-80years, 30 pack-year currently smoking OR have quit w/in 15years.) does not qualify.    Vision Screening: Recommended annual ophthalmology exams for early detection of glaucoma and other disorders of the eye. Is the patient up to date with their annual eye exam?  Yes   Dental Screening: Recommended annual dental exams for proper oral hygiene.  Community Resource Referral / Chronic Care Management: CRR required this visit?  No   CCM required this visit?  No      Plan:   Keep all routine maintenance appointments.   Follow up 02/20/21 @ 10:00  I have personally reviewed and noted the following in the patient's chart:   . Medical and social history . Use of alcohol, tobacco or illicit drugs  . Current medications and supplements . Functional ability and status . Nutritional status . Physical activity . Advanced directives . List of other physicians . Hospitalizations, surgeries, and ER visits in  previous 12 months . Vitals . Screenings to include cognitive, depression, and falls . Referrals and appointments  In addition, I have reviewed and discussed with patient certain preventive protocols, quality metrics, and best practice  recommendations. A written personalized care plan for preventive services as well as general preventive health recommendations were provided to patient via mychart.     Varney Biles, LPN   07/22/8117

## 2020-05-30 NOTE — Patient Instructions (Addendum)
Tanya Fox , Thank you for taking time to come for your Medicare Wellness Visit. I appreciate your ongoing commitment to your health goals. Please review the following plan we discussed and let me know if I can assist you in the future.   These are the goals we discussed: Goals    . Follow up with Primary Care Provider     As needed       This is a list of the screening recommended for you and due dates:  Health Maintenance  Topic Date Due  . COVID-19 Vaccine (1) 02/20/2021*  . Pap Smear  11/22/2020  . Mammogram  01/02/2022  . Colon Cancer Screening  07/31/2022  . Tetanus Vaccine  12/21/2029  . Flu Shot  Completed  . DEXA scan (bone density measurement)  Completed  .  Hepatitis C: One time screening is recommended by Center for Disease Control  (CDC) for  adults born from [birth year] through 1963.   Completed  . Pneumonia vaccines  Completed  *Topic was postponed. The date shown is not the original due date.    Immunizations Immunization History  Administered Date(s) Administered  . Influenza Split 02/01/2014  . Influenza, High Dose Seasonal PF 02/03/2018  . Influenza,inj,Quad PF,6+ Mos 01/15/2017  . Influenza-Unspecified 01/31/2014, 01/04/2020  . PPD Test 03/16/2012, 03/09/2013, 03/14/2014  . Pneumococcal Conjugate-13 11/17/2018  . Pneumococcal Polysaccharide-23 02/18/2020  . Tdap 01/01/2011, 12/22/2019  . Zoster Recombinat (Shingrix) 11/18/2018, 01/27/2019   Keep all routine maintenance appointments.   Follow up 02/20/21 @ 10:00  Advanced directives: End of life planning; Advance aging; Advanced directives discussed.  Copy of current HCPOA/Living Will requested.    Conditions/risks identified: none new.  Follow up in one year for your annual wellness visit.   Preventive Care 38 Years and Older, Female Preventive care refers to lifestyle choices and visits with your health care provider that can promote health and wellness. What does preventive care include?  A yearly  physical exam. This is also called an annual well check.  Dental exams once or twice a year.  Routine eye exams. Ask your health care provider how often you should have your eyes checked.  Personal lifestyle choices, including:  Daily care of your teeth and gums.  Regular physical activity.  Eating a healthy diet.  Avoiding tobacco and drug use.  Limiting alcohol use.  Practicing safe sex.  Taking low-dose aspirin every day.  Taking vitamin and mineral supplements as recommended by your health care provider. What happens during an annual well check? The services and screenings done by your health care provider during your annual well check will depend on your age, overall health, lifestyle risk factors, and family history of disease. Counseling  Your health care provider may ask you questions about your:  Alcohol use.  Tobacco use.  Drug use.  Emotional well-being.  Home and relationship well-being.  Sexual activity.  Eating habits.  History of falls.  Memory and ability to understand (cognition).  Work and work Statistician.  Reproductive health. Screening  You may have the following tests or measurements:  Height, weight, and BMI.  Blood pressure.  Lipid and cholesterol levels. These may be checked every 5 years, or more frequently if you are over 36 years old.  Skin check.  Lung cancer screening. You may have this screening every year starting at age 30 if you have a 30-pack-year history of smoking and currently smoke or have quit within the past 15 years.  Fecal occult blood test (  FOBT) of the stool. You may have this test every year starting at age 13.  Flexible sigmoidoscopy or colonoscopy. You may have a sigmoidoscopy every 5 years or a colonoscopy every 10 years starting at age 24.  Hepatitis C blood test.  Hepatitis B blood test.  Sexually transmitted disease (STD) testing.  Diabetes screening. This is done by checking your blood sugar  (glucose) after you have not eaten for a while (fasting). You may have this done every 1-3 years.  Bone density scan. This is done to screen for osteoporosis. You may have this done starting at age 75.  Mammogram. This may be done every 1-2 years. Talk to your health care provider about how often you should have regular mammograms. Talk with your health care provider about your test results, treatment options, and if necessary, the need for more tests. Vaccines  Your health care provider may recommend certain vaccines, such as:  Influenza vaccine. This is recommended every year.  Tetanus, diphtheria, and acellular pertussis (Tdap, Td) vaccine. You may need a Td booster every 10 years.  Zoster vaccine. You may need this after age 42.  Pneumococcal 13-valent conjugate (PCV13) vaccine. One dose is recommended after age 40.  Pneumococcal polysaccharide (PPSV23) vaccine. One dose is recommended after age 51. Talk to your health care provider about which screenings and vaccines you need and how often you need them. This information is not intended to replace advice given to you by your health care provider. Make sure you discuss any questions you have with your health care provider. Document Released: 04/28/2015 Document Revised: 12/20/2015 Document Reviewed: 01/31/2015 Elsevier Interactive Patient Education  2017 [place] Prevention in the Home Falls can cause injuries. They can happen to people of all ages. There are many things you can do to make your home safe and to help prevent falls. What can I do on the outside of my home?  Regularly fix the edges of walkways and driveways and fix any cracks.  Remove anything that might make you trip as you walk through a door, such as a raised step or threshold.  Trim any bushes or trees on the path to your home.  Use bright outdoor lighting.  Clear any walking paths of anything that might make someone trip, such as rocks or  tools.  Regularly check to see if handrails are loose or broken. Make sure that both sides of any steps have handrails.  Any raised decks and porches should have guardrails on the edges.  Have any leaves, snow, or ice cleared regularly.  Use sand or salt on walking paths during winter.  Clean up any spills in your garage right away. This includes oil or grease spills. What can I do in the bathroom?  Use night lights.  Install grab bars by the toilet and in the tub and shower. Do not use towel bars as grab bars.  Use non-skid mats or decals in the tub or shower.  If you need to sit down in the shower, use a plastic, non-slip stool.  Keep the floor dry. Clean up any water that spills on the floor as soon as it happens.  Remove soap buildup in the tub or shower regularly.  Attach bath mats securely with double-sided non-slip rug tape.  Do not have throw rugs and other things on the floor that can make you trip. What can I do in the bedroom?  Use night lights.  Make sure that you have a light  by your bed that is easy to reach.  Do not use any sheets or blankets that are too big for your bed. They should not hang down onto the floor.  Have a firm chair that has side arms. You can use this for support while you get dressed.  Do not have throw rugs and other things on the floor that can make you trip. What can I do in the kitchen?  Clean up any spills right away.  Avoid walking on wet floors.  Keep items that you use a lot in easy-to-reach places.  If you need to reach something above you, use a strong step stool that has a grab bar.  Keep electrical cords out of the way.  Do not use floor polish or wax that makes floors slippery. If you must use wax, use non-skid floor wax.  Do not have throw rugs and other things on the floor that can make you trip. What can I do with my stairs?  Do not leave any items on the stairs.  Make sure that there are handrails on both  sides of the stairs and use them. Fix handrails that are broken or loose. Make sure that handrails are as long as the stairways.  Check any carpeting to make sure that it is firmly attached to the stairs. Fix any carpet that is loose or worn.  Avoid having throw rugs at the top or bottom of the stairs. If you do have throw rugs, attach them to the floor with carpet tape.  Make sure that you have a light switch at the top of the stairs and the bottom of the stairs. If you do not have them, ask someone to add them for you. What else can I do to help prevent falls?  Wear shoes that:  Do not have high heels.  Have rubber bottoms.  Are comfortable and fit you well.  Are closed at the toe. Do not wear sandals.  If you use a stepladder:  Make sure that it is fully opened. Do not climb a closed stepladder.  Make sure that both sides of the stepladder are locked into place.  Ask someone to hold it for you, if possible.  Clearly mark and make sure that you can see:  Any grab bars or handrails.  First and last steps.  Where the edge of each step is.  Use tools that help you move around (mobility aids) if they are needed. These include:  Canes.  Walkers.  Scooters.  Crutches.  Turn on the lights when you go into a dark area. Replace any light bulbs as soon as they burn out.  Set up your furniture so you have a clear path. Avoid moving your furniture around.  If any of your floors are uneven, fix them.  If there are any pets around you, be aware of where they are.  Review your medicines with your doctor. Some medicines can make you feel dizzy. This can increase your chance of falling. Ask your doctor what other things that you can do to help prevent falls. This information is not intended to replace advice given to you by your health care provider. Make sure you discuss any questions you have with your health care provider. Document Released: 01/26/2009 Document Revised:  09/07/2015 Document Reviewed: 05/06/2014 Elsevier Interactive Patient Education  2017 [employer].

## 2020-05-31 ENCOUNTER — Telehealth: Payer: Self-pay

## 2020-05-31 NOTE — Telephone Encounter (Signed)
Pfizer 1 05/15/19 [id number] Pfizer 2 06/06/19 [id number]  Pfizer 3 01/31/20 [id number]

## 2020-05-31 NOTE — Telephone Encounter (Signed)
Noted in chart.

## 2020-07-31 ENCOUNTER — Telehealth: Payer: Self-pay

## 2020-07-31 NOTE — Telephone Encounter (Signed)
mychart was sent to instruct patient what to do since she has tested positive for Covid.   [place] Night - Cl TELEPHONE ADVICE RECORD AccessNurse Patient Name: [facility] AND REWS Gender: Female DOB: 1950-09-27 Age: 70 Y 9 M 5 D Return Phone Number: [phone] (Primary) Address: City/ State/ Zip: [place] [address] Client [facility] [place] Night - Cl Client Site [place] Physician Einar Pheasant - MD Contact Type Call Who Is Calling Patient / Member / Family / Caregiver Call Type Triage / Clinical Relationship To Patient Self Return Phone Number [phone] (Primary) Chief Complaint CHEST PAIN - pain, pressure, heaviness or tightness Reason for Call Symptomatic / Request for [place] states she was exposed to someone with Covid on yesterday. States she feel warm and her chest hurt. In home test came back positive. Runny nose and sore throat. Couldn't elaborate the chest hurt Translation No Nurse Assessment Nurse: Laurance Flatten, RN, Geni Bers Date/Time (Eastern Time): 07/28/2020 9:19:23 PM Confirm and document reason for call. If symptomatic, describe symptoms. ---Caller stated she was exposed to someone with covid yesterday and today she tested positive for covid today. Caller stated current symptoms are HA, sore throat, runny nose, fatigue and body aches. No fever at present. No cough. Caller denied chest pain. Caller stated she has received the vaccine and one booster. Does the patient have any new or worsening symptoms? ---Yes Will a triage be completed? ---Yes Related visit to physician within the last 2 weeks? ---No Does the PT have any chronic conditions? (i.e. diabetes, asthma, this includes High risk factors for pregnancy, etc.) ---No Is this a behavioral health or substance abuse call? ---No Guidelines Guideline Title Affirmed Question  Affirmed Notes Nurse Date/Time ([place] Time) COVID-19 - Diagnosed or Suspected [1] COVID-19 diagnosed by positive lab test Laurance Flatten, RN, Geni Bers 07/28/2020 9:22:52 PM PLEASE NOTE: All timestamps contained within this report are represented as [place] Standard Time. CONFIDENTIALTY NOTICE: This fax transmission is intended only for the addressee. It contains information that is legally privileged, confidential or otherwise protected from use or disclosure. If you are not the intended recipient, you are strictly prohibited from reviewing, disclosing, copying using or disseminating any of this information or taking any action in reliance on or regarding this information. If you have received this fax in error, please notify us immediately by telephone so that we can arrange for its return to [place]. Phone: [phone], Toll-Free: [phone], Fax: [phone] Page: 2 of 3 Call Id: [id number] Guidelines Guideline Title Affirmed Question Affirmed Notes Nurse Date/Time Eilene Ghazi Time) (e.g., PCR, rapid self-test kit) AND [2] mild symptoms (e.g., cough, fever, others) AND [2] no complications or SOB Disp. Time Eilene Ghazi Time) Disposition Final User 07/28/2020 9:17:31 PM Send to Urgent Queue Chuck Hint 07/28/2020 9:27:29 [place], RN, Althea Grimmer Disagree/Comply Comply Caller Understands Yes PreDisposition InappropriateToAsk Care Advice Given Per Guideline HOME CARE: * You should be able to treat this at home. REASSURANCE AND EDUCATION - POSITIVE COVID-19 LAB TEST AND MILD SYMPTOMS: * You had a recent lab test for COVID-19 and it came back positive. * A positive result on a PCR or rapid self-test kit is highly accurate for diagnosing COVID-19. It is highly likely that you have COVID-19. * From what you have told me, your symptoms are mild. That is reassuring. * Here's some care advice to help you and to help prevent others from getting sick. GENERAL CARE ADVICE FOR  COVID-19 SYMPTOMS: * The symptoms are generally treated the same whether you have COVID-19, influenza or some other respiratory virus. * Feeling dehydrated: Drink extra liquids. If the air in your home is dry, use a humidifier. HUMIDIFIER: * If the air is dry, use a humidifier in the bedroom. * Dry air makes coughs worse. PAIN AND FEVER MEDICINES: * For pain or fever relief, take either acetaminophen or ibuprofen. * They are over-the-counter (OTC) drugs that help treat both fever and pain. You can buy them at the drugstore. * Treat fevers above 101 F (38.3 C). The goal of fever therapy is to bring the fever down to a comfortable level. Remember that fever medicine usually lowers fever 2 degrees F (1 - 1 1/2 degrees C). * ACETAMINOPHEN REGULAR STRENGTH TYLENOL: Take 650 mg (two 325 mg pills) by mouth every 4 to 6 hours as needed. Each Regular Strength Tylenol pill has 325 mg of acetaminophen. The most you should take each day is 3,250 mg (10 pills a day). * IBUPROFEN (E.G., MOTRIN, ADVIL): Take 400 mg (two 200 mg pills) by mouth every 6 hours. The most you should take each day is 1,200 mg (six 200 mg pills), unless your doctor has told you to take more. CALL BACK IF: * Fever over 103 F (39.4 C) * Chest pain or difficulty breathing occurs * You become worse COVID-19 - HOW TO PROTECT OTHERS - WHEN YOU ARE SICK WITH COVID-19: * STAY HOME A MINIMUM OF 5 DAYS: Home isolation is needed for at least 5 days after the symptoms started. Stay home from school or work if you are sick. Do NOT go to religious services, child care centers, shopping, or other public places. Do NOT use public transportation (e.g., bus, taxis, ride-sharing). Do NOT allow any visitors to your home. Leave the house only if you need to seek urgent medical care. * WEAR A MASK FOR 10 DAYS: Wear a well-fitted mask for 10 full days any time you are around others inside your home or in public. Do not go to places where you are unable to wear  a mask. * [place] HANDS OFTEN: Wash hands often with soap and water. After coughing or sneezing are important times. If soap and water are not available, use an alcohol-based hand sanitizer with at least 60% alcohol, covering all surfaces of your hands and rubbing them together until they feel dry. Avoid touching your eyes, nose, and mouth with unwashed hands. * CALL AHEAD IF MEDICAL CARE IS NEEDED: If you have a medical appointment, call your doctor's office and tell them you have or may have COVID-19. This will help the office protect themselves and other patients. They will give you directions.      [place] Night - Cl TELEPHONE ADVICE RECORD AccessNurse Patient Name: [facility] AND REWS Gender: Female DOB: [birth year] Age: 84 Y 9 M 5 D Return Phone Number: [phone] (Primary) Address: City/ State/ Zip: [place] [address] Client [facility] Night - Cl Client Site [place] Physician Einar Pheasant - MD Contact Type Call Who Is Calling Patient / Member / Family / Caregiver Call Type Triage / Clinical Relationship To Patient Self Return Phone Number [phone] (Primary) Chief Complaint Health information question (non symptomatic) Reason for Call Symptomatic / Request for [place] states she tested positive for Covid last night. She want to know about the medications she can take Translation No  Nurse Assessment Nurse: Gwendalyn Ege, RN, Clarice Date/Time (Eastern Time): 07/29/2020 1:18:02 PM Confirm and document reason for call. If symptomatic, describe symptoms. ---Caller states she tested positive for COVID last night. Reports she has sore throat, headache, low grade fever, nasal congestion and fatigue. Does the patient have any new or worsening symptoms? ---Yes Will a triage be completed? ---Yes Related visit to physician within the last 2 weeks?  ---No Does the PT have any chronic conditions? (i.e. diabetes, asthma, this includes High risk factors for pregnancy, etc.) ---No Is this a behavioral health or substance abuse call? ---No Guidelines Guideline Title Affirmed Question Affirmed Notes Nurse Date/Time ([place] Time) COVID-19 - Diagnosed or Suspected [1] COVID-19 diagnosed by positive lab test (e.g., PCR, rapid self-test kit) AND [2] mild symptoms (e.g., cough, fever, others) AND [3] no Seals, RN, Clarice 07/29/2020 1:20:54 PM PLEASE NOTE: All timestamps contained within this report are represented as [place] Standard Time. CONFIDENTIALTY NOTICE: This fax transmission is intended only for the addressee. It contains information that is legally privileged, confidential or otherwise protected from use or disclosure. If you are not the intended recipient, you are strictly prohibited from reviewing, disclosing, copying using or disseminating any of this information or taking any action in reliance on or regarding this information. If you have received this fax in error, please notify us immediately by telephone so that we can arrange for its return to [place]. Phone: [phone], Toll-Free: [phone], Fax: [phone] Page: 2 of 2 Call Id: [id number] Guidelines Guideline Title Affirmed Question Affirmed Notes Nurse Date/Time Eilene Ghazi Time) complications or SOB Disp. Time Eilene Ghazi Time) Disposition Final User 07/29/2020 1:25:45 PM Call PCP when Office is Open Yes Seals, RN, Hydrographic surveyor Disposition Overriden: Home Care Override Reason: Patient's symptoms need a higher level of care Caller Disagree/Comply Comply Caller Understands Yes PreDisposition Call a family member Care Advice Given Per Guideline CALL PCP WHEN OFFICE IS OPEN: * You need to discuss this with your doctor (or NP/PA) within the next few days. * Call the office when it is open. CALL BACK IF: * Fever over 103 F (39.4 C) * Chest pain or difficulty breathing  occurs * You become worse CARE ADVICE given per COVID-19 - DIAGNOSED OR SUSPECTED (Adult) guideline. * HOME REMEDY - HARD CANDY: Hard candy works just as well as over-the-counter cough drops. People who have diabetes should use sugar-free candy. * HOME REMEDY - HONEY: This old home remedy has been shown to help decrease coughing at night. The adult dosage is 2 teaspoons (10 ml) at bedtime. HUMIDIFIER: * If the air is dry, use a humidifier in the bedroom. * Dry air makes coughs worse. AVOID TOBACCO SMOKE: * Avoid tobacco smoke. * Smoking or being exposed to smoke makes coughs much worse. Referrals REFERRED TO PCP OFFICE

## 2020-08-01 NOTE — Telephone Encounter (Signed)
Called to schedule Tanya Fox a virtual appointment with Dr. Nicki Reaper if wanted. Left a message to call back.

## 2020-08-01 NOTE — Telephone Encounter (Signed)
Patient returned office phone call. She said she is good and does not need a virtual, thank you for calling.

## 2020-08-01 NOTE — Telephone Encounter (Signed)
Please call and confirm pt doing ok.  If needs, can schedule virtual appt

## 2020-08-02 ENCOUNTER — Telehealth: Payer: Medicare HMO | Admitting: Internal Medicine

## 2020-08-28 ENCOUNTER — Telehealth: Payer: Self-pay | Admitting: Internal Medicine

## 2020-08-28 NOTE — Telephone Encounter (Signed)
yes

## 2020-08-28 NOTE — Telephone Encounter (Signed)
Pt has been scheduled.  °

## 2020-08-28 NOTE — Telephone Encounter (Signed)
Do you want to set up an appt to discuss other treatment options?

## 2020-08-28 NOTE — Telephone Encounter (Signed)
Patient called in wanted to know if there is something else she can take instead of the alendronate (FOSAMAX) 70 MG tablet  because she can not remember to take it on monday's

## 2020-09-06 ENCOUNTER — Ambulatory Visit: Payer: Medicare HMO | Admitting: Internal Medicine

## 2020-09-13 ENCOUNTER — Encounter: Payer: Self-pay | Admitting: Internal Medicine

## 2020-09-13 ENCOUNTER — Ambulatory Visit (INDEPENDENT_AMBULATORY_CARE_PROVIDER_SITE_OTHER): Payer: Medicare HMO | Admitting: Internal Medicine

## 2020-09-13 ENCOUNTER — Other Ambulatory Visit: Payer: Self-pay

## 2020-09-13 DIAGNOSIS — M810 Age-related osteoporosis without current pathological fracture: Secondary | ICD-10-CM | POA: Diagnosis not present

## 2020-09-13 DIAGNOSIS — Z853 Personal history of malignant neoplasm of breast: Secondary | ICD-10-CM

## 2020-09-13 NOTE — Progress Notes (Signed)
Patient ID: Tanya Fox, female   DOB: September 27, 1950, 70 y.o.   MRN: [id number]   Subjective:    Patient ID: Tanya Fox, female    DOB: [birth year], 70 y.o.   MRN: [id number]  HPI This visit occurred during the SARS-CoV-2 public health emergency.  Safety protocols were in place, including screening questions prior to the visit, additional usage of staff PPE, and extensive cleaning of exam room while observing appropriate contact time as indicated for disinfecting solutions.  Patient here for work in to discuss osteoporosis treatment.  Discussed recent bone density.  She has been taking fosamax.  Hard to remember to take.  Wants to discuss other options.  Discussed calcium and vitamin D.  Also discussed reclast and prolia.  Discussed possible side effects of medication.  She stays active.  No chest pain or sob reported.  No abdominal pain or bowel change reported.    Past Medical History:  Diagnosis Date  . Atrophic vaginitis   . BRCA negative   . Breast cancer ([place]) 2003   Left side, Stage I  . Collar bone fracture   . Osteoporosis 12/2018   T score -3.1   Past Surgical History:  Procedure Laterality Date  . BREAST SURGERY  2003-2004   Lumpectomy-Lymph nodes-Port a cath-Radiation and Chemo  . CESAREAN SECTION     X 2  . TOTAL HIP ARTHROPLASTY     Left  . TUBAL LIGATION     Family History  Problem Relation Age of Onset  . Hypertension Mother   . Breast cancer Mother        Age 28  . Heart disease Father   . Alcohol abuse Father   . Breast cancer Sister        Age 61  . Cancer Maternal Grandfather        Colon cancer  . Heart disease Maternal Grandfather   . Diabetes Paternal Grandmother    Social History   Socioeconomic History  . Marital status: Married    Spouse name: Not on file  . Number of children: 2  . Years of education: Not on file  . Highest education level: Not on file  Occupational History    Employer: [employer]  Tobacco Use  . Smoking  status: Never Smoker  . Smokeless tobacco: Never Used  Vaping Use  . Vaping Use: Never used  Substance and Sexual Activity  . Alcohol use: Yes    Alcohol/week: 0.0 standard drinks    Comment: very rare  . Drug use: No  . Sexual activity: Yes    Birth control/protection: Surgical    Comment: BTL-1st intercourse 60 yo-1 partner  Other Topics Concern  . Not on file  Social History Narrative  . Not on file   Social Determinants of Health   Financial Resource Strain: Low Risk   . Difficulty of Paying Living Expenses: Not hard at all  Food Insecurity: No Food Insecurity  . Worried About Charity fundraiser in the Last Year: Never true  . Ran Out of Food in the Last Year: Never true  Transportation Needs: No Transportation Needs  . Lack of Transportation (Medical): No  . Lack of Transportation (Non-Medical): No  Physical Activity: Sufficiently Active  . Days of Exercise per Week: 5 days  . Minutes of Exercise per Session: 30 min  Stress: No Stress Concern Present  . Feeling of Stress : Not at all  Social Connections: Unknown  . Frequency of Communication with  Friends and Family: More than three times a week  . Frequency of Social Gatherings with Friends and Family: More than three times a week  . Attends Religious Services: Not on file  . Active Member of Clubs or Organizations: Not on file  . Attends Archivist Meetings: Not on file  . Marital Status: Not on file    Outpatient Encounter Medications as of 09/13/2020  Medication Sig  . Cholecalciferol (VITAMIN D PO) Take by mouth.  . hydrOXYzine (ATARAX/VISTARIL) 10 MG tablet TAKE 1 TO 2 TABLETS BY MOUTH AT BEDTIME AS NEEDED  . Multiple Vitamin (MULTIVITAMIN) tablet Take 1 tablet by mouth daily.    . [DISCONTINUED] alendronate (FOSAMAX) 70 MG tablet Take 1 tablet (70 mg total) by mouth every 7 (seven) days. Take with a full glass of water on an empty stomach. (Patient not taking: Reported on 09/13/2020)   No  facility-administered encounter medications on file as of 09/13/2020.    Review of Systems  Constitutional: Negative for appetite change and unexpected weight change.  HENT: Negative for congestion and sinus pressure.   Respiratory: Negative for cough, chest tightness and shortness of breath.   Cardiovascular: Negative for chest pain, palpitations and leg swelling.  Gastrointestinal: Negative for abdominal pain, diarrhea, nausea and vomiting.  Genitourinary: Negative for difficulty urinating and dysuria.  Musculoskeletal: Negative for joint swelling and myalgias.  Skin: Negative for color change and rash.  Neurological: Negative for dizziness, light-headedness and headaches.  Psychiatric/Behavioral: Negative for agitation and dysphoric mood.       Objective:    Physical Exam Vitals reviewed.  Constitutional:      General: She is not in acute distress.    Appearance: Normal appearance.  HENT:     Head: Normocephalic and atraumatic.     Right Ear: External ear normal.     Left Ear: External ear normal.  Eyes:     General: No scleral icterus.       Right eye: No discharge.        Left eye: No discharge.     Conjunctiva/sclera: Conjunctivae normal.  Neck:     Thyroid: No thyromegaly.  Cardiovascular:     Rate and Rhythm: Normal rate and regular rhythm.  Pulmonary:     Effort: No respiratory distress.     Breath sounds: Normal breath sounds. No wheezing.  Abdominal:     General: Bowel sounds are normal.     Palpations: Abdomen is soft.     Tenderness: There is no abdominal tenderness.  Musculoskeletal:        General: No swelling or tenderness.     Cervical back: Neck supple. No tenderness.  Lymphadenopathy:     Cervical: No cervical adenopathy.  Skin:    Findings: No erythema or rash.  Neurological:     Mental Status: She is alert.  Psychiatric:        Mood and Affect: Mood normal.        Behavior: Behavior normal.     BP 102/60   Pulse 85   Temp 98.2 F (36.8  C)   Resp 16   Ht _0  (1.626 m)   Wt 110 lb (49.9 kg)   SpO2 98%   BMI 18.88 kg/m  Wt Readings from Last 3 Encounters:  09/13/20 110 lb (49.9 kg)  05/30/20 110 lb (49.9 kg)  02/18/20 110 lb (49.9 kg)     Lab Results  Component Value Date   WBC 4.5 02/15/2020   HGB  13.9 02/15/2020   HCT 41.4 02/15/2020   PLT 216.0 02/15/2020   GLUCOSE 70 02/15/2020   CHOL 170 02/15/2020   TRIG 56.0 02/15/2020   HDL 73.20 02/15/2020   LDLCALC 86 02/15/2020   ALT 21 02/15/2020   AST 27 02/15/2020   NA 139 02/15/2020   K 3.9 02/15/2020   CL 103 02/15/2020   CREATININE 0.60 02/15/2020   BUN 24 (H) 02/15/2020   CO2 29 02/15/2020   TSH 3.24 02/15/2020   INR 1.2 (H) 03/28/2016    No results found.     Assessment & Plan:   Problem List Items Addressed This Visit    History of breast cancer    Mammogram 01/03/20 - [facility] II.       Osteoporosis    Discussed bone density.  Discussed treatment options.  Tried fosamax.  Discussed reclast and prolia.  Discussed calcium, vitamin D and weight bearing exercise.  She preferred reclast and agrees to referral to endocrinology.        Relevant Orders   Ambulatory referral to Endocrinology       Einar Pheasant, MD

## 2020-09-18 ENCOUNTER — Encounter: Payer: Self-pay | Admitting: Internal Medicine

## 2020-09-18 NOTE — Assessment & Plan Note (Signed)
Mammogram 01/03/20 - Birads II.

## 2020-09-18 NOTE — Assessment & Plan Note (Signed)
Discussed bone density.  Discussed treatment options.  Tried fosamax.  Discussed reclast and prolia.  Discussed calcium, vitamin D and weight bearing exercise.  She preferred reclast and agrees to referral to endocrinology.

## 2020-09-29 ENCOUNTER — Telehealth: Payer: Self-pay | Admitting: Internal Medicine

## 2020-09-29 NOTE — Telephone Encounter (Signed)
Patient is going to go to the walk in to be evaluated.

## 2020-09-29 NOTE — Telephone Encounter (Signed)
Transfer to [place] at Access Nurse  PT called advise that she might have a UTI. She states she is having stomach pains and discomfort. PT states she constantly has to go but does not have any blood in her urine. Advise Acces Nurse no apt available.

## 2020-10-02 NOTE — Telephone Encounter (Addendum)
[place] access nurse documentation. Pt called in stating they have a UTI but did not speak to access nurse due to a meeting concerning family.

## 2020-10-05 ENCOUNTER — Telehealth: Payer: Self-pay | Admitting: Internal Medicine

## 2020-10-05 NOTE — Telephone Encounter (Signed)
PT called to advise that the referral for the ENDOCRINOLOGY has gone in but the ENDOCRINOLOGY place is requesting a referral for the bone density as well to be put in. Please advise.

## 2020-10-05 NOTE — Telephone Encounter (Signed)
Please advise if you would like yo order for patient?

## 2020-10-06 NOTE — Telephone Encounter (Signed)
Placed call to pt. Pt states they needed the previous bone density faxed over. Papers will be faxed over.

## 2020-10-06 NOTE — Telephone Encounter (Signed)
I do not mind ordering the bone density, but it has not been 2 years since her last.  Her last bone density was 12/2018.  Insurance usually does not cover bone density if has been < 2 years.  Let me know if she still wants me to schedule.

## 2020-11-14 ENCOUNTER — Telehealth: Payer: Self-pay | Admitting: Internal Medicine

## 2020-11-14 MED ORDER — HYDROXYZINE HCL 10 MG PO TABS: ORAL_TABLET | ORAL | 1 refills | Status: DC

## 2020-11-14 NOTE — Telephone Encounter (Signed)
Patient is requesting a refill on her hydrOXYzine (ATARAX/VISTARIL) 10 MG tablet.

## 2020-11-15 ENCOUNTER — Ambulatory Visit: Payer: Self-pay

## 2020-11-15 ENCOUNTER — Other Ambulatory Visit: Payer: Self-pay

## 2020-11-15 ENCOUNTER — Emergency Department: Payer: Medicare HMO

## 2020-11-15 ENCOUNTER — Emergency Department
Admission: EM | Admit: 2020-11-15 | Discharge: 2020-11-15 | Disposition: A | Discharge status: Home / Self Care | Payer: Medicare HMO | Attending: Emergency Medicine | Admitting: Emergency Medicine

## 2020-11-15 DIAGNOSIS — L03011 Cellulitis of right finger: Secondary | ICD-10-CM | POA: Diagnosis not present

## 2020-11-15 DIAGNOSIS — M79644 Pain in right finger(s): Secondary | ICD-10-CM | POA: Diagnosis present

## 2020-11-15 DIAGNOSIS — Z96642 Presence of left artificial hip joint: Secondary | ICD-10-CM | POA: Diagnosis not present

## 2020-11-15 DIAGNOSIS — Z853 Personal history of malignant neoplasm of breast: Secondary | ICD-10-CM | POA: Insufficient documentation

## 2020-11-15 DIAGNOSIS — M19041 Primary osteoarthritis, right hand: Secondary | ICD-10-CM | POA: Diagnosis not present

## 2020-11-15 DIAGNOSIS — M7989 Other specified soft tissue disorders: Secondary | ICD-10-CM | POA: Diagnosis not present

## 2020-11-15 DIAGNOSIS — S61210A Laceration without foreign body of right index finger without damage to nail, initial encounter: Secondary | ICD-10-CM | POA: Diagnosis not present

## 2020-11-15 MED ORDER — DOCUSATE SODIUM 100 MG PO CAPS: ORAL_CAPSULE | ORAL | 0 refills | Status: DC

## 2020-11-15 MED ORDER — CEPHALEXIN 500 MG PO CAPS
500.0000 mg | ORAL_CAPSULE | Freq: Once | ORAL | Status: AC
  Administered 2020-11-15: 500 mg via ORAL
  Filled 2020-11-15: qty 1

## 2020-11-15 MED ORDER — OXYCODONE-ACETAMINOPHEN 5-325 MG PO TABS
2.0000 | ORAL_TABLET | Freq: Once | ORAL | Status: AC
  Administered 2020-11-15: 2 via ORAL
  Filled 2020-11-15: qty 2

## 2020-11-15 MED ORDER — OXYCODONE-ACETAMINOPHEN 5-325 MG PO TABS: 2.0000 | ORAL_TABLET | Freq: Four times a day (QID) | ORAL | 0 refills | Status: DC | PRN

## 2020-11-15 MED ORDER — LIDOCAINE HCL (PF) 1 % IJ SOLN
5.0000 mL | Freq: Once | INTRAMUSCULAR | Status: AC
  Administered 2020-11-15: 5 mL
  Filled 2020-11-15: qty 5

## 2020-11-15 MED ORDER — SULFAMETHOXAZOLE-TRIMETHOPRIM 800-160 MG PO TABS
2.0000 | ORAL_TABLET | Freq: Once | ORAL | Status: AC
  Administered 2020-11-15: 2 via ORAL
  Filled 2020-11-15: qty 2

## 2020-11-15 MED ORDER — CEPHALEXIN 500 MG PO CAPS: 500.0000 mg | ORAL_CAPSULE | Freq: Four times a day (QID) | ORAL | 0 refills | Status: AC

## 2020-11-15 MED ORDER — SULFAMETHOXAZOLE-TRIMETHOPRIM 800-160 MG PO TABS: 2.0000 | ORAL_TABLET | Freq: Two times a day (BID) | ORAL | 0 refills | Status: AC

## 2020-11-15 NOTE — ED Triage Notes (Signed)
Pt presents to ER c/o right pointer finger pain that happened after she lacerated it on 7/25.  Pt states that on Monday, her finger began to swell, and became very painful and warm to the touch.  Finger appears very swollen and red at this time.

## 2020-11-15 NOTE — ED Provider Notes (Signed)
[facility] Emergency Department Provider Note  ____________________________________________   Event Date/Time   First MD Initiated Contact with Patient 11/15/20 0123     (approximate)  I have reviewed the triage vital signs and the nursing notes.   HISTORY  Chief Complaint Finger Injury    HPI Tanya Fox is a 70 y.o. female who presents for evaluation of pain and swelling to the pad of her right index finger.  She said that about 10 days ago she scratched it on a screw on a dock.  It seemed to be okay until about 3 days ago when it started to become more painful, red, and swell.  Now seems to have pus underneath the skin and has darkened areas as well.  The pain is severe.  Moving it makes it worse and nothing in particular makes it better.  She denies fever or any involvement beyond the distal tip of her right index finger.     Past Medical History:  Diagnosis Date   Atrophic vaginitis    BRCA negative    Breast cancer ([place]) 2003   Left side, Stage I   Collar bone fracture    Osteoporosis 12/2018   T score -3.1    Patient Active Problem List   Diagnosis Date Noted   Osteoporosis 01/25/2020   Hernia of fascia 06/03/2018   Sleeping difficulties 06/21/2017   Left hip pain 02/28/2016   Pre-op evaluation 02/28/2016   Osteoarthritis of hip 12/27/2015   Health care maintenance 08/28/2015   Groin pain 07/11/2015   Back pain 03/06/2014   Leukopenia 03/05/2012   Osteopenia    History of breast cancer     Past Surgical History:  Procedure Laterality Date   BREAST SURGERY  2003-2004   Lumpectomy-Lymph nodes-Port a cath-Radiation and Chemo   CESAREAN SECTION     X 2   TOTAL HIP ARTHROPLASTY     Left   TUBAL LIGATION      Prior to Admission medications   Medication Sig Start Date End Date Taking? Authorizing Provider  cephALEXin (KEFLEX) 500 MG capsule Take 1 capsule (500 mg total) by mouth 4 (four) times daily for 7 days. 11/15/20  11/22/20 Yes Hinda Kehr, MD  docusate sodium (COLACE) 100 MG capsule Take 1 tablet once or twice daily as needed for constipation while taking narcotic pain medicine 11/15/20  Yes Hinda Kehr, MD  oxyCODONE-acetaminophen (PERCOCET) 5-325 MG tablet Take 2 tablets by mouth every 6 (six) hours as needed for severe pain. 11/15/20  Yes Hinda Kehr, MD  sulfamethoxazole-trimethoprim (BACTRIM DS) 800-160 MG tablet Take 2 tablets by mouth 2 (two) times daily for 7 days. 11/15/20 11/22/20 Yes Hinda Kehr, MD  Cholecalciferol (VITAMIN D PO) Take by mouth.    [provider]  hydrOXYzine (ATARAX/VISTARIL) 10 MG tablet TAKE 1 TO 2 TABLETS BY MOUTH AT BEDTIME AS NEEDED 11/14/20   Einar Pheasant, MD  Multiple Vitamin (MULTIVITAMIN) tablet Take 1 tablet by mouth daily.      [provider]    Allergies Patient has no known allergies.  Family History  Problem Relation Age of Onset   Hypertension Mother    Breast cancer Mother        Age 32   Heart disease Father    Alcohol abuse Father    Breast cancer Sister        Age 33   Cancer Maternal Grandfather        Colon cancer   Heart disease  Maternal Grandfather    Diabetes Paternal Grandmother     Social History Social History   Tobacco Use   Smoking status: Never   Smokeless tobacco: Never  Vaping Use   Vaping Use: Never used  Substance Use Topics   Alcohol use: Yes    Alcohol/week: 0.0 standard drinks    Comment: very rare   Drug use: No    Review of Systems Constitutional: No fever/chills Eyes: No visual changes. Cardiovascular: Denies chest pain. Respiratory: Denies shortness of breath. Gastrointestinal: No abdominal pain.  No nausea, no vomiting. Genitourinary: No complaints Musculoskeletal: Pain and swelling in the right index finger Integumentary: Redness and purulence in the fingertip of the right index finger Neurological: Negative for headaches, focal weakness or  numbness.   ____________________________________________   PHYSICAL EXAM:  VITAL SIGNS: ED Triage Vitals  Enc Vitals Group     BP 11/15/20 0113 (!) 150/87     Pulse Rate 11/15/20 0113 67     Resp 11/15/20 0113 16     Temp 11/15/20 0113 98 F (36.7 C)     Temp Source 11/15/20 0113 Oral     SpO2 11/15/20 0113 98 %     Weight 11/15/20 0114 53.5 kg (118 lb)     Height 11/15/20 0114 1.626 m (5' 4" )     Head Circumference --      Peak Flow --      Pain Score 11/15/20 0114 10     Pain Loc --      Pain Edu? --      Excl. in [place]? --     Constitutional: Alert and oriented.  Eyes: Conjunctivae are normal.  Head: Atraumatic. Nose: No congestion/rhinnorhea. Mouth/Throat: Patient is wearing a mask. Neck: No stridor.  No meningeal signs.   Cardiovascular: Normal rate, regular rhythm. Good peripheral circulation. Respiratory: Normal respiratory effort.  No retractions. Gastrointestinal: Soft and nondistended. Musculoskeletal: The distal phalanx of the right index finger is erythematous on the dorsal aspect.  The distal pad of the finger is edematous, erythematous, with what appears to be an indurated purulent region beneath the skin with some hematoma as well.  The affected digit does not extend proximally beyond the DIP.  Very tender to palpation. Neurologic:  Normal speech and language. No gross focal neurologic deficits are appreciated.  Skin:  Skin is warm, dry and intact.  Abnormal right index finger as described above in musculoskeletal section. Psychiatric: Mood and affect are normal. Speech and behavior are normal.  ____________________________________________    RADIOLOGY I, Hinda Kehr, personally viewed and evaluated these images (plain radiographs) as part of my medical decision making, as well as reviewing the written report by the radiologist.  ED MD interpretation: No acute abnormality other than soft tissue swelling on x-ray  Official radiology report(s): DG Finger  Index Right  Result Date: 11/15/2020 CLINICAL DATA:  History of recent finger laceration. EXAM: RIGHT INDEX FINGER 2+V COMPARISON:  None. FINDINGS: There is no evidence of fracture or dislocation. Degenerative changes seen involving the DIP joint. Diffuse soft tissue swelling is noted. IMPRESSION: Diffuse soft tissue swelling without evidence of an acute osseous abnormality. Electronically Signed   By: Virgina Norfolk M.D.   On: 11/15/2020 01:30    ____________________________________________   PROCEDURES   Procedure(s) performed (including Critical Care):  Marland KitchenMarland KitchenIncision and Drainage  Date/Time: 11/15/2020 4:15 AM Performed by: Hinda Kehr, MD Authorized by: Hinda Kehr, MD   Consent:    Consent obtained:  Verbal   Consent given  by:  Patient   Risks discussed:  Bleeding, infection, incomplete drainage and pain   Alternatives discussed:  Alternative treatment, delayed treatment and observation Universal protocol:    Procedure explained and questions answered to patient or proxy's satisfaction: yes     Imaging studies available: yes     Site/side marked: yes     Patient identity confirmed:  Verbally with patient Location:    Type:  Abscess   Location:  Upper extremity   Upper extremity location:  Finger   Finger location:  R index finger Pre-procedure details:    Skin preparation:  Povidone-iodine Anesthesia:    Anesthesia method:  Nerve block   Block location:  Proximal phalanx of right index finger   Block needle gauge:  25 G   Block anesthetic:  Lidocaine 1% w/o epi   Block injection procedure:  Anatomic landmarks identified, introduced needle, incremental injection, anatomic landmarks palpated and negative aspiration for blood   Block outcome:  Anesthesia achieved Procedure type:    Complexity:  Complex Procedure details:    Incision type: bilateral straight incisions.   Wound management:  Probed and deloculated   Drainage:  Bloody   Drainage amount:  Moderate    Wound treatment:  Wound left open   Packing materials:  None Post-procedure details:    Procedure completion:  Tolerated well, no immediate complications   ____________________________________________   INITIAL IMPRESSION / MDM / ASSESSMENT AND PLAN / ED COURSE  As part of my medical decision making, I reviewed the following data within the [facility] notes reviewed and incorporated and Notes from prior ED visits   Differential diagnosis includes, but is not limited to, felon, cellulitis, abscess, gangrene, osteomyelitis, flexor tenosynovitis.  I personally reviewed the patient's imaging and agree with the radiologist's interpretation that there is no bony involvement, just diffuse soft tissue swelling.  Physical exam appears consistent with a relatively small felon.  Only the pad of the right index finger seems to be substantially involved but there does appear to be purulent material beneath the skin.  We discussed the risks and benefits of incision and drainage of the fingertip in detail, including but not limited to incomplete drainage and neurovascular damage.  She would like to proceed.  She received 2 Percocet, Keflex 500 mg, and Bactrim DS 2 tablets prior to the procedure.  I then performed a successful digital block and performed bilateral incisions with blunt dissection to try to break up any loculations and get through all of the septations within the fingertip.  The wounds bled a moderate amount but there was minimal purulent material drainage.  However the finger seems to be somewhat decompressed and not as swollen as before the procedure.  We went over wound care instructions and I provided prescriptions as listed below.  I stressed the importance of close follow-up with orthopedics, specifically hand specialist, and I provided follow-up information for Dr. Peggye Ley.  She understands and agrees the plan, and she and her husband both understand indications to  return immediately to the emergency department.           ____________________________________________  FINAL CLINICAL IMPRESSION(S) / ED DIAGNOSES  Final diagnoses:  Felon of finger of right hand     MEDICATIONS GIVEN DURING THIS VISIT:  Medications  cephALEXin (KEFLEX) capsule 500 mg (500 mg Oral Given 11/15/20 0231)  sulfamethoxazole-trimethoprim (BACTRIM DS) 800-160 MG per tablet 2 tablet (2 tablets Oral Given 11/15/20 0231)  oxyCODONE-acetaminophen (PERCOCET/ROXICET) 5-325 MG per tablet  2 tablet (2 tablets Oral Given 11/15/20 0231)  lidocaine (PF) (XYLOCAINE) 1 % injection 5 mL (5 mLs Other Given by Other 11/15/20 0344)     ED Discharge Orders          Ordered    cephALEXin (KEFLEX) 500 MG capsule  4 times daily        11/15/20 0408    sulfamethoxazole-trimethoprim (BACTRIM DS) 800-160 MG tablet  2 times daily        11/15/20 0408    oxyCODONE-acetaminophen (PERCOCET) 5-325 MG tablet  Every 6 hours PRN        11/15/20 0408    docusate sodium (COLACE) 100 MG capsule        11/15/20 0408             Note:  This document was prepared using Dragon voice recognition software and may include unintentional dictation errors.   Hinda Kehr, MD 11/15/20 [phone]

## 2020-11-15 NOTE — Discharge Instructions (Addendum)
Please keep the wound clean and dry.  You can soak it in water but do not use peroxide or alcohol.  Change the dressing at least twice daily and put a thin layer of bacitracin or nonadherent dressing over the wound.  Keep it elevated when possible.  Take the prescribed antibiotics as written and call in the morning to schedule the next available follow-up with Dr. Peggye Ley or one of her colleagues.  They should be able to get you an appointment later this week.  If you develop any new or worsening symptoms that concern you, such as spreading of the pain and swelling to other fingers or to your hand, fever, etc., please return immediately to the emergency department.  Use over-the-counter ibuprofen and/or Tylenol according to label instructions for pain. Take Percocet as prescribed for severe pain. Do not drink alcohol, drive or participate in any other potentially dangerous activities while taking this medication as it may make you sleepy. Do not take this medication with any other sedating medications, either prescription or over-the-counter. If you were prescribed Percocet or Vicodin, do not take these with acetaminophen (Tylenol) as it is already contained within these medications.   This medication is an opiate (or narcotic) pain medication and can be habit forming.  Use it as little as possible to achieve adequate pain control.  Do not use or use it with extreme caution if you have a history of opiate abuse or dependence.  If you are on a pain contract with your primary care doctor or a pain specialist, be sure to let them know you were prescribed this medication today from the [facility] Emergency Department.  This medication is intended for your use only - do not give any to anyone else and keep it in a secure place where nobody else, especially children, have access to it.  It will also cause or worsen constipation, so you may want to consider taking an over-the-counter stool softener while you are  taking this medication.

## 2020-11-15 NOTE — ED Notes (Signed)
Report received from Justin,RN.

## 2020-11-15 NOTE — ED Notes (Signed)
ED Provider at bedside. 

## 2020-11-16 ENCOUNTER — Telehealth: Payer: Self-pay

## 2020-11-16 NOTE — Telephone Encounter (Signed)
Pt has questions about the two antibiotics that were prescribed in the ED yesterday.

## 2020-11-20 DIAGNOSIS — L03011 Cellulitis of right finger: Secondary | ICD-10-CM | POA: Diagnosis not present

## 2020-11-20 NOTE — Telephone Encounter (Signed)
LMTCB

## 2020-11-20 NOTE — Telephone Encounter (Signed)
Reviewed. If persistent infection or any change in her finger or any worsening of symptoms, she needs to be seen.

## 2020-11-20 NOTE — Telephone Encounter (Signed)
FYI- just wanted you to be aware of this  Called patient to follow up from message last week. She had questions about the antibiotics she was given in the ED last week but would not tell me what the questions were and stated that she called last week and did not hear back so the questions are "irrelevant at this point." Advised patient that I was out of the office last week and apologized that she did not get a call back. Patient stated nothing further is needed. She stated that she was sick Thursday, Friday and Saturday and stopped the medication on her own since she did not get a call back and would not go in to detail about how much of the abx she completed before stopping them on her own. Attempted to get more info but patient repeatedly stated that she was fine and then hung up the phone.

## 2020-11-21 NOTE — Telephone Encounter (Signed)
LMTCB

## 2020-11-23 NOTE — Telephone Encounter (Signed)
LMTCB

## 2020-11-27 NOTE — Telephone Encounter (Signed)
LMTCB

## 2020-11-28 DIAGNOSIS — M7061 Trochanteric bursitis, right hip: Secondary | ICD-10-CM | POA: Diagnosis not present

## 2021-01-02 DIAGNOSIS — L03011 Cellulitis of right finger: Secondary | ICD-10-CM | POA: Diagnosis not present

## 2021-01-03 DIAGNOSIS — Z1231 Encounter for screening mammogram for malignant neoplasm of breast: Secondary | ICD-10-CM | POA: Diagnosis not present

## 2021-01-03 LAB — HM MAMMOGRAPHY

## 2021-01-04 ENCOUNTER — Encounter: Payer: Self-pay | Admitting: Obstetrics & Gynecology

## 2021-01-25 ENCOUNTER — Ambulatory Visit: Payer: Medicare HMO | Admitting: Obstetrics & Gynecology

## 2021-01-25 ENCOUNTER — Ambulatory Visit: Payer: Medicare HMO | Admitting: Obstetrics and Gynecology

## 2021-02-14 DIAGNOSIS — M810 Age-related osteoporosis without current pathological fracture: Secondary | ICD-10-CM | POA: Diagnosis not present

## 2021-02-19 DIAGNOSIS — M8589 Other specified disorders of bone density and structure, multiple sites: Secondary | ICD-10-CM | POA: Diagnosis not present

## 2021-02-19 DIAGNOSIS — M810 Age-related osteoporosis without current pathological fracture: Secondary | ICD-10-CM | POA: Diagnosis not present

## 2021-02-19 LAB — HM DEXA SCAN

## 2021-02-20 ENCOUNTER — Ambulatory Visit (INDEPENDENT_AMBULATORY_CARE_PROVIDER_SITE_OTHER): Payer: Medicare HMO | Admitting: Internal Medicine

## 2021-02-20 ENCOUNTER — Other Ambulatory Visit: Payer: Self-pay

## 2021-02-20 VITALS — BP 102/70 | HR 66 | Temp 97.9°F | Resp 16 | Ht 64.0 in | Wt 111.6 lb

## 2021-02-20 DIAGNOSIS — Z13220 Encounter for screening for lipoid disorders: Secondary | ICD-10-CM

## 2021-02-20 DIAGNOSIS — D72819 Decreased white blood cell count, unspecified: Secondary | ICD-10-CM

## 2021-02-20 DIAGNOSIS — Z853 Personal history of malignant neoplasm of breast: Secondary | ICD-10-CM

## 2021-02-20 DIAGNOSIS — Z0000 Encounter for general adult medical examination without abnormal findings: Secondary | ICD-10-CM

## 2021-02-20 DIAGNOSIS — R197 Diarrhea, unspecified: Secondary | ICD-10-CM | POA: Diagnosis not present

## 2021-02-20 DIAGNOSIS — M810 Age-related osteoporosis without current pathological fracture: Secondary | ICD-10-CM | POA: Diagnosis not present

## 2021-02-20 NOTE — Progress Notes (Signed)
Patient ID: Tanya Fox, female   DOB: 03/09/1951, 70 y.o.   MRN: [id number]   Subjective:    Patient ID: Tanya Fox, female    DOB: 11-16-1950, 70 y.o.   MRN: [id number]  This visit occurred during the SARS-CoV-2 public health emergency.  Safety protocols were in place, including screening questions prior to the visit, additional usage of staff PPE, and extensive cleaning of exam room while observing appropriate contact time as indicated for disinfecting solutions.   Patient here for her physical exam.  Chief Complaint  Patient presents with   Follow-up    Yearly follow up   .   HPI Gets her breasts and pap smears through gyn.  She reports she is doing well.  Saw Dr Honor Junes for evaluation - osteoporosis.  Planning for reclast.  Exercises.  No chest pain or sob.  No acid reflux reported.  Persistent diarrhea.  Takes imodium.  Has been present for years.  Last colonoscopy 2014.  Request referral to GI - given persistent diarrhea.  Has seen GI years ago for above.  No fever.  No weight loss.     Past Medical History:  Diagnosis Date   Atrophic vaginitis    BRCA negative    Breast cancer ([place]) 2003   Left side, Stage I   Collar bone fracture    Osteoporosis 12/2018   T score -3.1   Past Surgical History:  Procedure Laterality Date   BREAST SURGERY  2003-2004   Lumpectomy-Lymph nodes-Port a cath-Radiation and Chemo   CESAREAN SECTION     X 2   TOTAL HIP ARTHROPLASTY     Left   TUBAL LIGATION     Family History  Problem Relation Age of Onset   Hypertension Mother    Breast cancer Mother        Age 41   Heart disease Father    Alcohol abuse Father    Breast cancer Sister        Age 69   Cancer Maternal Grandfather        Colon cancer   Heart disease Maternal Grandfather    Diabetes Paternal Grandmother    Social History   Socioeconomic History   Marital status: Married    Spouse name: Not on file   Number of children: 2   Years of education: Not on file    Highest education level: Not on file  Occupational History    Employer: [employer]  Tobacco Use   Smoking status: Never   Smokeless tobacco: Never  Vaping Use   Vaping Use: Never used  Substance and Sexual Activity   Alcohol use: Yes    Alcohol/week: 0.0 standard drinks    Comment: very rare   Drug use: No   Sexual activity: Yes    Birth control/protection: Surgical    Comment: BTL-1st intercourse 66 yo-1 partner  Other Topics Concern   Not on file  Social History Narrative   Not on file   Social Determinants of Health   Financial Resource Strain: Low Risk    Difficulty of Paying Living Expenses: Not hard at all  Food Insecurity: No Food Insecurity   Worried About Charity fundraiser in the Last Year: Never true   [place] in the Last Year: Never true  Transportation Needs: No Transportation Needs   Lack of Transportation (Medical): No   Lack of Transportation (Non-Medical): No  Physical Activity: Sufficiently Active   Days of Exercise per  Week: 5 days   Minutes of Exercise per Session: 30 min  Stress: No Stress Concern Present   Feeling of Stress : Not at all  Social Connections: Unknown   Frequency of Communication with Friends and Family: More than three times a week   Frequency of Social Gatherings with Friends and Family: More than three times a week   Attends Religious Services: Not on Electrical engineer or Organizations: Not on file   Attends Archivist Meetings: Not on file   Marital Status: Not on file     Review of Systems  Constitutional:  Negative for fatigue and unexpected weight change.  HENT:  Negative for congestion, sinus pressure and sore throat.   Eyes:  Negative for pain and visual disturbance.  Respiratory:  Negative for cough, chest tightness and shortness of breath.   Cardiovascular:  Negative for chest pain and palpitations.  Gastrointestinal:  Positive for diarrhea. Negative for abdominal pain,  constipation, nausea and vomiting.  Genitourinary:  Negative for difficulty urinating and dysuria.  Musculoskeletal:  Negative for joint swelling and myalgias.  Skin:  Negative for color change and rash.  Neurological:  Negative for dizziness, light-headedness and headaches.  Hematological:  Negative for adenopathy. Does not bruise/bleed easily.  Psychiatric/Behavioral:  Negative for agitation and dysphoric mood.       Objective:     BP 102/70   Pulse 66   Temp 97.9 F (36.6 C)   Resp 16   Ht 5' 4"  (1.626 m)   Wt 111 lb 9.6 oz (50.6 kg)   SpO2 97%   BMI 19.16 kg/m  Wt Readings from Last 3 Encounters:  02/20/21 111 lb 9.6 oz (50.6 kg)  11/15/20 118 lb (53.5 kg)  09/13/20 110 lb (49.9 kg)    Physical Exam Vitals reviewed.  Constitutional:      General: She is not in acute distress.    Appearance: Normal appearance.  HENT:     Head: Normocephalic and atraumatic.     Right Ear: External ear normal.     Left Ear: External ear normal.  Eyes:     General: No scleral icterus.       Right eye: No discharge.        Left eye: No discharge.     Conjunctiva/sclera: Conjunctivae normal.  Neck:     Thyroid: No thyromegaly.  Cardiovascular:     Rate and Rhythm: Normal rate and regular rhythm.  Pulmonary:     Effort: No respiratory distress.     Breath sounds: Normal breath sounds. No wheezing.  Abdominal:     General: Bowel sounds are normal.     Palpations: Abdomen is soft.     Tenderness: There is no abdominal tenderness.  Musculoskeletal:        General: No swelling or tenderness.     Cervical back: Neck supple. No tenderness.  Lymphadenopathy:     Cervical: No cervical adenopathy.  Skin:    Findings: No erythema or rash.  Neurological:     Mental Status: She is alert.  Psychiatric:        Mood and Affect: Mood normal.        Behavior: Behavior normal.     Outpatient Encounter Medications as of 02/20/2021  Medication Sig   Cholecalciferol (VITAMIN D PO) Take by  mouth.   hydrOXYzine (ATARAX/VISTARIL) 10 MG tablet TAKE 1 TO 2 TABLETS BY MOUTH AT BEDTIME AS NEEDED   Multiple Vitamin (MULTIVITAMIN) tablet  Take 1 tablet by mouth daily.     [DISCONTINUED] docusate sodium (COLACE) 100 MG capsule Take 1 tablet once or twice daily as needed for constipation while taking narcotic pain medicine   [DISCONTINUED] oxyCODONE-acetaminophen (PERCOCET) 5-325 MG tablet Take 2 tablets by mouth every 6 (six) hours as needed for severe pain.   No facility-administered encounter medications on file as of 02/20/2021.     Lab Results  Component Value Date   WBC 4.5 02/15/2020   HGB 13.9 02/15/2020   HCT 41.4 02/15/2020   PLT 216.0 02/15/2020   GLUCOSE 70 02/15/2020   CHOL 170 02/15/2020   TRIG 56.0 02/15/2020   HDL 73.20 02/15/2020   LDLCALC 86 02/15/2020   ALT 21 02/15/2020   AST 27 02/15/2020   NA 139 02/15/2020   K 3.9 02/15/2020   CL 103 02/15/2020   CREATININE 0.60 02/15/2020   BUN 24 (H) 02/15/2020   CO2 29 02/15/2020   TSH 3.24 02/15/2020   INR 1.2 (H) 03/28/2016    DG Finger Index Right  Result Date: 11/15/2020 CLINICAL DATA:  History of recent finger laceration. EXAM: RIGHT INDEX FINGER 2+V COMPARISON:  None. FINDINGS: There is no evidence of fracture or dislocation. Degenerative changes seen involving the DIP joint. Diffuse soft tissue swelling is noted. IMPRESSION: Diffuse soft tissue swelling without evidence of an acute osseous abnormality. Electronically Signed   By: Virgina Norfolk M.D.   On: 11/15/2020 01:30       Assessment & Plan:   Problem List Items Addressed This Visit     Diarrhea    Has been an issue for years.  Previously saw Dr Tiffany Kocher.  Takes imodium.  Last colonoscopy 2014.  Request referral to GI.        Relevant Orders   Ambulatory referral to Gastroenterology   Health care maintenance    Breast, pelvic and pap smear through gyn. Mammogram per report - 01/04/21 - [facility] II.Marland Kitchen  Colonoscopy 07/2012.  Refer to GI for evaluation  - f/u colonoscopy/diarrhea.       History of breast cancer    Mammogram 01/04/21 - Birads II.       Leukopenia   Relevant Orders   CBC with Differential/Platelet   Basic metabolic panel   Protein Electrophoresis, (serum)   Osteoporosis    Saw Dr Honor Junes.  Planning to start getting reclast.  Follow.  Continue calcium, vitamin D and weight bearing exercise.        Other Visit Diagnoses     Routine general medical examination at a health care facility    -  Primary   Screening cholesterol level       Relevant Orders   Hepatic function panel   Lipid panel        Einar Pheasant, MD

## 2021-02-25 ENCOUNTER — Encounter: Payer: Self-pay | Admitting: Internal Medicine

## 2021-02-25 NOTE — Assessment & Plan Note (Signed)
Has been an issue for years.  Previously saw Dr Tiffany Kocher.  Takes imodium.  Last colonoscopy 2014.  Request referral to GI.

## 2021-02-25 NOTE — Assessment & Plan Note (Signed)
Mammogram 01/04/21 - Birads II.

## 2021-02-25 NOTE — Assessment & Plan Note (Signed)
Saw Dr Honor Junes.  Planning to start getting reclast.  Follow.  Continue calcium, vitamin D and weight bearing exercise.

## 2021-02-25 NOTE — Assessment & Plan Note (Signed)
Breast, pelvic and pap smear through gyn. Mammogram per report - 01/04/21 - Birads II.Marland Kitchen  Colonoscopy 07/2012.  Refer to GI for evaluation - f/u colonoscopy/diarrhea.

## 2021-03-14 ENCOUNTER — Ambulatory Visit: Payer: Medicare HMO | Admitting: Obstetrics & Gynecology

## 2021-03-20 ENCOUNTER — Encounter: Payer: Self-pay | Admitting: Obstetrics & Gynecology

## 2021-03-22 DIAGNOSIS — M810 Age-related osteoporosis without current pathological fracture: Secondary | ICD-10-CM | POA: Diagnosis not present

## 2021-04-17 ENCOUNTER — Other Ambulatory Visit: Payer: Medicare HMO

## 2021-04-23 ENCOUNTER — Ambulatory Visit: Payer: Self-pay | Admitting: Obstetrics & Gynecology

## 2021-04-24 ENCOUNTER — Other Ambulatory Visit: Payer: Medicare HMO

## 2021-04-27 ENCOUNTER — Other Ambulatory Visit: Payer: Self-pay

## 2021-04-27 ENCOUNTER — Ambulatory Visit (INDEPENDENT_AMBULATORY_CARE_PROVIDER_SITE_OTHER): Payer: Medicare HMO | Admitting: Obstetrics & Gynecology

## 2021-04-27 ENCOUNTER — Other Ambulatory Visit (HOSPITAL_COMMUNITY)
Admission: RE | Admit: 2021-04-27 | Discharge: 2021-04-27 | Disposition: A | Discharge status: Home / Self Care | Payer: Medicare HMO | Source: Ambulatory Visit | Attending: Obstetrics & Gynecology | Admitting: Obstetrics & Gynecology

## 2021-04-27 ENCOUNTER — Encounter: Payer: Self-pay | Admitting: Obstetrics & Gynecology

## 2021-04-27 VITALS — BP 118/76 | Ht 60.5 in | Wt 112.0 lb

## 2021-04-27 DIAGNOSIS — Z780 Asymptomatic menopausal state: Secondary | ICD-10-CM | POA: Diagnosis not present

## 2021-04-27 DIAGNOSIS — M810 Age-related osteoporosis without current pathological fracture: Secondary | ICD-10-CM

## 2021-04-27 DIAGNOSIS — Z01419 Encounter for gynecological examination (general) (routine) without abnormal findings: Secondary | ICD-10-CM

## 2021-04-27 DIAGNOSIS — Z9189 Other specified personal risk factors, not elsewhere classified: Secondary | ICD-10-CM

## 2021-04-27 DIAGNOSIS — K4090 Unilateral inguinal hernia, without obstruction or gangrene, not specified as recurrent: Secondary | ICD-10-CM | POA: Diagnosis not present

## 2021-04-27 DIAGNOSIS — Z853 Personal history of malignant neoplasm of breast: Secondary | ICD-10-CM

## 2021-04-27 DIAGNOSIS — D1722 Benign lipomatous neoplasm of skin and subcutaneous tissue of left arm: Secondary | ICD-10-CM | POA: Diagnosis not present

## 2021-04-27 NOTE — Progress Notes (Signed)
Tanya Fox Jan 09, 1951 [id number]   History:    71 y.o. G2P2L2  RP:  Established patient presenting fo annual gynecologic exam  HPI:  Postmenopausal, well on no HRT.  No hot flashes or night sweats.  No vaginal bleeding. No pelvic Pain.  Pap smear Neg 11/2016.  Pap reflex done today.  No history of abnormal Pap smears. History of left breast cancer in 2003.  Mammogram Neg 12/2020.  Colonoscopy 2014. Osteoporosis.  DEXA 03/2021.  T score -2.9 at the Rt distal third of radius.  This was stable from the prior DEXA, other values were osteopenic range.  Started on Reclast IV injection yearly in [place].  On Vit D and Ca++ 1.5 g/d total, wt bearing physical activities. BMI 21.51   Past medical history,surgical history, family history and social history were all reviewed and documented in the EPIC chart.  Gynecologic History No LMP recorded. Patient is postmenopausal.  Obstetric History OB History  Gravida Para Term Preterm AB Living  2 2 2     2   SAB IAB Ectopic Multiple Live Births               # Outcome Date GA Lbr Len/2nd Weight Sex Delivery Anes PTL Lv  2 Term           1 Term              ROS: A ROS was performed and pertinent positives and negatives are included in the history.  GENERAL: No fevers or chills. HEENT: No change in vision, no earache, sore throat or sinus congestion. NECK: No pain or stiffness. CARDIOVASCULAR: No chest pain or pressure. No palpitations. PULMONARY: No shortness of breath, cough or wheeze. GASTROINTESTINAL: No abdominal pain, nausea, vomiting or diarrhea, melena or bright red blood per rectum. GENITOURINARY: No urinary frequency, urgency, hesitancy or dysuria. MUSCULOSKELETAL: No joint or muscle pain, no back pain, no recent trauma. DERMATOLOGIC: No rash, no itching, no lesions. ENDOCRINE: No polyuria, polydipsia, no heat or cold intolerance. No recent change in weight. HEMATOLOGICAL: No anemia or easy bruising or bleeding. NEUROLOGIC: No headache,  seizures, numbness, tingling or weakness. PSYCHIATRIC: No depression, no loss of interest in normal activity or change in sleep pattern.     Exam:   BP 118/76    Ht 5' 0.5" (1.537 m)    Wt 112 lb (50.8 kg)    BMI 21.51 kg/m   Body mass index is 21.51 kg/m.  General appearance : Well developed well nourished female. No acute distress HEENT: Eyes: no retinal hemorrhage or exudates,  Neck supple, trachea midline, no carotid bruits, no thyroidmegaly Lungs: Clear to auscultation, no rhonchi or wheezes, or rib retractions  Heart: Regular rate and rhythm, no murmurs or gallops Breast:Examined in sitting and supine position were symmetrical in appearance, no palpable masses or tenderness,  no skin retraction, no nipple inversion, no nipple discharge, no skin discoloration, no axillary or supraclavicular lymphadenopathy Abdomen: no palpable masses or tenderness, no rebound or guarding Extremities: no edema or skin discoloration or tenderness  Pelvic: Vulva: Normal             Vagina: No gross lesions or discharge  Cervix: No gross lesions or discharge.  Pap reflex done.  Uterus  AV, normal size, shape and consistency, non-tender and mobile  Adnexa  Without masses or tenderness  Anus: Normal   Assessment/Plan:  71 y.o. female for annual exam   1. Encounter for routine gynecological examination with Papanicolaou smear  of cervix Postmenopausal, well on no HRT.  No hot flashes or night sweats.  No vaginal bleeding. No pelvic Pain.  Pap smear Neg 11/2016.  Pap reflex done today.  No history of abnormal Pap smears. History of left breast cancer in 2003.  Mammogram Neg 12/2020.  Colonoscopy 2014. Osteoporosis.  DEXA 03/2021.  T score -2.9 at the Rt distal third of radius.  This was stable from the prior DEXA, other values were osteopenic range.  Started on Reclast IV injection yearly in [place].  On Vit D and Ca++ 1.5 g/d total, wt bearing physical activities. BMI 21.51 - Cytology - PAP( [facility])  2. At risk of fracture due to osteoporosis  3. Postmenopause Postmenopausal, well on no HRT.  No hot flashes or night sweats.  No vaginal bleeding. No pelvic Pain.    4. Osteoporosis of forearm Osteoporosis.  DEXA 03/2021.  T score -2.9 at the Rt distal third of radius.  This was stable from the prior DEXA, other values were osteopenic range.  Started on Reclast IV injection yearly in [place].  On Vit D and Ca++ 1.5 g/d total, wt bearing physical activities.  5. History of breast cancer  History of left breast cancer in 2003.  Genetic screening Neg.  Mammogram Neg 12/2020.    Other orders - zoledronic acid (RECLAST) 5 MG/100ML SOLN injection; Inject 5 mg into the vein once.   Princess Bruins MD, 3:56 PM 04/27/2021

## 2021-04-30 LAB — CYTOLOGY - PAP: Diagnosis: NEGATIVE

## 2021-05-02 ENCOUNTER — Other Ambulatory Visit: Payer: Self-pay

## 2021-05-02 ENCOUNTER — Other Ambulatory Visit (INDEPENDENT_AMBULATORY_CARE_PROVIDER_SITE_OTHER): Payer: Medicare HMO

## 2021-05-02 DIAGNOSIS — D72819 Decreased white blood cell count, unspecified: Secondary | ICD-10-CM | POA: Diagnosis not present

## 2021-05-02 DIAGNOSIS — Z13220 Encounter for screening for lipoid disorders: Secondary | ICD-10-CM | POA: Diagnosis not present

## 2021-05-02 LAB — CBC WITH DIFFERENTIAL/PLATELET
Basophils Absolute: 0 10*3/uL (ref 0.0–0.1)
Basophils Relative: 1.3 % (ref 0.0–3.0)
Eosinophils Absolute: 0.2 10*3/uL (ref 0.0–0.7)
Eosinophils Relative: 6.7 % — ABNORMAL HIGH (ref 0.0–5.0)
HCT: 40.7 % (ref 36.0–46.0)
Hemoglobin: 13.4 g/dL (ref 12.0–15.0)
Lymphocytes Relative: 48.3 % — ABNORMAL HIGH (ref 12.0–46.0)
Lymphs Abs: 1.7 10*3/uL (ref 0.7–4.0)
MCHC: 33 g/dL (ref 30.0–36.0)
MCV: 94.8 fl (ref 78.0–100.0)
Monocytes Absolute: 0.3 10*3/uL (ref 0.1–1.0)
Monocytes Relative: 9.6 % (ref 3.0–12.0)
Neutro Abs: 1.2 10*3/uL — ABNORMAL LOW (ref 1.4–7.7)
Neutrophils Relative %: 34.1 % — ABNORMAL LOW (ref 43.0–77.0)
Platelets: 235 10*3/uL (ref 150.0–400.0)
RBC: 4.29 Mil/uL (ref 3.87–5.11)
RDW: 13.5 % (ref 11.5–15.5)
WBC: 3.5 10*3/uL — ABNORMAL LOW (ref 4.0–10.5)

## 2021-05-02 LAB — HEPATIC FUNCTION PANEL
ALT: 16 U/L (ref 0–35)
AST: 23 U/L (ref 0–37)
Albumin: 4.1 g/dL (ref 3.5–5.2)
Alkaline Phosphatase: 65 U/L (ref 39–117)
Bilirubin, Direct: 0.1 mg/dL (ref 0.0–0.3)
Total Bilirubin: 0.6 mg/dL (ref 0.2–1.2)
Total Protein: 6.6 g/dL (ref 6.0–8.3)

## 2021-05-02 LAB — LIPID PANEL
Cholesterol: 202 mg/dL — ABNORMAL HIGH (ref 0–200)
HDL: 73.7 mg/dL (ref >39.00)
LDL Cholesterol: 107 mg/dL — ABNORMAL HIGH (ref 0–99)
NonHDL: 128.55
Total CHOL/HDL Ratio: 3
Triglycerides: 109 mg/dL (ref 0.0–149.0)
VLDL: 21.8 mg/dL (ref 0.0–40.0)

## 2021-05-02 LAB — BASIC METABOLIC PANEL
BUN: 16 mg/dL (ref 6–23)
CO2: 31 mEq/L (ref 19–32)
Calcium: 8.7 mg/dL (ref 8.4–10.5)
Chloride: 105 mEq/L (ref 96–112)
Creatinine, Ser: 0.57 mg/dL (ref 0.40–1.20)
GFR: 93.31 mL/min (ref >60.00)
Glucose, Bld: 68 mg/dL — ABNORMAL LOW (ref 70–99)
Potassium: 4.3 mEq/L (ref 3.5–5.1)
Sodium: 141 mEq/L (ref 135–145)

## 2021-05-03 ENCOUNTER — Other Ambulatory Visit: Payer: Self-pay

## 2021-05-03 ENCOUNTER — Telehealth: Payer: Self-pay

## 2021-05-03 DIAGNOSIS — D72819 Decreased white blood cell count, unspecified: Secondary | ICD-10-CM

## 2021-05-03 NOTE — Telephone Encounter (Signed)
LMTCB regarding labs 

## 2021-05-03 NOTE — Telephone Encounter (Signed)
Patient voiced understanding of her lab results. She has been scheduled for her repeat CBC.

## 2021-05-03 NOTE — Telephone Encounter (Signed)
CBC ordered 

## 2021-05-03 NOTE — Telephone Encounter (Signed)
Patient returned office phone call for lab results. 

## 2021-05-04 LAB — PROTEIN ELECTROPHORESIS, SERUM
Abnormal Protein Band1: 0.4 g/dL — ABNORMAL HIGH
Albumin ELP: 4 g/dL (ref 3.8–4.8)
Alpha 1: 0.3 g/dL (ref 0.2–0.3)
Alpha 2: 0.7 g/dL (ref 0.5–0.9)
Beta 2: 0.3 g/dL (ref 0.2–0.5)
Beta Globulin: 0.3 g/dL — ABNORMAL LOW (ref 0.4–0.6)
Gamma Globulin: 0.9 g/dL (ref 0.8–1.7)
Total Protein: 6.5 g/dL (ref 6.1–8.1)

## 2021-05-14 ENCOUNTER — Telehealth: Payer: Self-pay | Admitting: Internal Medicine

## 2021-05-14 ENCOUNTER — Other Ambulatory Visit: Payer: Self-pay | Admitting: Internal Medicine

## 2021-05-14 ENCOUNTER — Other Ambulatory Visit: Payer: Self-pay

## 2021-05-14 DIAGNOSIS — R928 Other abnormal and inconclusive findings on diagnostic imaging of breast: Secondary | ICD-10-CM

## 2021-05-14 MED ORDER — HYDROXYZINE HCL 10 MG PO TABS: ORAL_TABLET | ORAL | 1 refills | Status: DC

## 2021-05-14 NOTE — Progress Notes (Signed)
Order placed for f/u right breast mammogram and ultrasound  

## 2021-05-14 NOTE — Telephone Encounter (Signed)
Pt stated she needs a refill for hydrOXYzine (ATARAX/VISTARIL) 10 MG tablet  [facility] is  [place], [place] Phone:  [phone]  Fax:  [phone]     Call pt at [phone].

## 2021-05-14 NOTE — Telephone Encounter (Signed)
Rx sent in

## 2021-05-17 DIAGNOSIS — Z1211 Encounter for screening for malignant neoplasm of colon: Secondary | ICD-10-CM | POA: Diagnosis not present

## 2021-05-17 DIAGNOSIS — R197 Diarrhea, unspecified: Secondary | ICD-10-CM | POA: Diagnosis not present

## 2021-05-21 DIAGNOSIS — R197 Diarrhea, unspecified: Secondary | ICD-10-CM | POA: Diagnosis not present

## 2021-05-22 ENCOUNTER — Telehealth: Payer: Self-pay | Admitting: Internal Medicine

## 2021-05-22 NOTE — Telephone Encounter (Signed)
Opened in error

## 2021-05-24 ENCOUNTER — Other Ambulatory Visit: Payer: Self-pay

## 2021-05-24 ENCOUNTER — Other Ambulatory Visit (INDEPENDENT_AMBULATORY_CARE_PROVIDER_SITE_OTHER): Payer: Medicare HMO

## 2021-05-24 DIAGNOSIS — D72819 Decreased white blood cell count, unspecified: Secondary | ICD-10-CM | POA: Diagnosis not present

## 2021-05-24 LAB — CBC WITH DIFFERENTIAL/PLATELET
Basophils Absolute: 0 10*3/uL (ref 0.0–0.1)
Basophils Relative: 1 % (ref 0.0–3.0)
Eosinophils Absolute: 0.2 10*3/uL (ref 0.0–0.7)
Eosinophils Relative: 5.5 % — ABNORMAL HIGH (ref 0.0–5.0)
HCT: 39.5 % (ref 36.0–46.0)
Hemoglobin: 13.3 g/dL (ref 12.0–15.0)
Lymphocytes Relative: 38.8 % (ref 12.0–46.0)
Lymphs Abs: 1.5 10*3/uL (ref 0.7–4.0)
MCHC: 33.6 g/dL (ref 30.0–36.0)
MCV: 95 fl (ref 78.0–100.0)
Monocytes Absolute: 0.4 10*3/uL (ref 0.1–1.0)
Monocytes Relative: 9.8 % (ref 3.0–12.0)
Neutro Abs: 1.7 10*3/uL (ref 1.4–7.7)
Neutrophils Relative %: 44.9 % (ref 43.0–77.0)
Platelets: 235 10*3/uL (ref 150.0–400.0)
RBC: 4.16 Mil/uL (ref 3.87–5.11)
RDW: 13.8 % (ref 11.5–15.5)
WBC: 3.8 10*3/uL — ABNORMAL LOW (ref 4.0–10.5)

## 2021-05-27 ENCOUNTER — Encounter: Payer: Self-pay | Admitting: Internal Medicine

## 2021-05-28 ENCOUNTER — Ambulatory Visit: Payer: Self-pay | Admitting: Obstetrics & Gynecology

## 2021-05-28 ENCOUNTER — Other Ambulatory Visit: Payer: Self-pay | Admitting: General Surgery

## 2021-05-28 ENCOUNTER — Other Ambulatory Visit: Payer: Self-pay

## 2021-05-28 DIAGNOSIS — D1722 Benign lipomatous neoplasm of skin and subcutaneous tissue of left arm: Secondary | ICD-10-CM | POA: Diagnosis not present

## 2021-05-28 DIAGNOSIS — D72819 Decreased white blood cell count, unspecified: Secondary | ICD-10-CM

## 2021-05-28 DIAGNOSIS — K4090 Unilateral inguinal hernia, without obstruction or gangrene, not specified as recurrent: Secondary | ICD-10-CM | POA: Diagnosis not present

## 2021-05-28 NOTE — Telephone Encounter (Signed)
See result note.  

## 2021-05-29 ENCOUNTER — Telehealth: Payer: Self-pay | Admitting: Internal Medicine

## 2021-05-29 ENCOUNTER — Telehealth: Payer: Self-pay | Admitting: Oncology

## 2021-05-29 NOTE — Telephone Encounter (Signed)
Pt scheduled with Dr Benay Spice

## 2021-05-29 NOTE — Telephone Encounter (Signed)
Patient called and stated she went to [facility] at [facility] in [place], Dr Wynonia Lawman in 2005 and would rather go back to see him. She is requesting a referral for that office and location.

## 2021-05-29 NOTE — Telephone Encounter (Signed)
-----   Message from Cammie Sickle, MD sent at 05/21/2021  8:43 PM EST ----- Regarding: RE: question Thank you for reaching out to me. I have reviewed her bloodwork.   M protein spike suggestive of MGUS. Will be happy to evaluate the patient further; discuss further follow up. GB ----- Message ----- From: Einar Pheasant, MD Sent: 05/19/2021  12:47 PM EST To: Cammie Sickle, MD Subject: question                                       Sorry to bother you and this is not an urgent question.  Just wanted to get your input - whenever you had a chance to look at [facility] lab.  She has a history of leukopenia.  Recent protein electrophoresis:    Total Protein 6.1 - 8.1 g/dL    6.5 Albumin ELP 3.8 - 4.8 g/dL    4.0        Alpha 1 0.2 - 0.3 g/dL    0.3        Alpha 2 0.5 - 0.9 g/dL    0.7        Beta Globulin 0.4 - 0.6 g/dL    0.3Low        Beta 2             0.2 - 0.5 g/dL    0.3        Gamma Globulin 0.8 - 1.7 g/dl  0.9        Abnormal Protein Band1    0.4High   Evaluation reveals a restricted band (M-spike)  migrating in the gamma globulin region.  There was not a measured level for M spike but the comment mentioned M spike present.  I was going to get her referred to you, but is there anything more I need to do prior to her seeing you.  Thank you for your help and sorry to bother you.    Hope you are doing well. Shaneese Tait

## 2021-05-29 NOTE — Telephone Encounter (Signed)
Contacted patient and scheduled initial visit from referral. Patient is aware of appointment time and date.

## 2021-05-31 ENCOUNTER — Ambulatory Visit (INDEPENDENT_AMBULATORY_CARE_PROVIDER_SITE_OTHER): Payer: Medicare HMO

## 2021-05-31 VITALS — Ht 60.5 in | Wt 112.0 lb

## 2021-05-31 DIAGNOSIS — Z0000 Encounter for general adult medical examination without abnormal findings: Secondary | ICD-10-CM | POA: Diagnosis not present

## 2021-05-31 NOTE — Progress Notes (Signed)
Subjective:   Tanya Fox is a 71 y.o. female who presents for Medicare Annual (Subsequent) preventive examination.  Review of Systems    No ROS.  Medicare Wellness Virtual Visit.  Visual/audio telehealth visit, UTA vital signs.   See social history for additional risk factors.   Cardiac Risk Factors include: advanced age (>72mn, >>64women)     Objective:    Today's Vitals   05/31/21 1236  Weight: 112 lb (50.8 kg)  Height: 5' 0.5" (1.537 m)   Body mass index is 21.51 kg/m.  Advanced Directives 05/31/2021 11/15/2020 05/30/2020  Does Patient Have a Medical Advance Directive? Yes No Yes  Type of AParamedicof AForadaLiving will - HDelhiLiving will  Does patient want to make changes to medical advance directive? No - Patient declined - No - Patient declined  Copy of [place] AFBin Chart? No - copy requested - No - copy requested    Current Medications (verified) Outpatient Encounter Medications as of 05/31/2021  Medication Sig   Cholecalciferol (VITAMIN D PO) Take by mouth.   hydrOXYzine (ATARAX) 10 MG tablet TAKE 1 TO 2 TABLETS BY MOUTH AT BEDTIME AS NEEDED   Multiple Vitamin (MULTIVITAMIN) tablet Take 1 tablet by mouth daily.     zoledronic acid (RECLAST) 5 MG/100ML SOLN injection Inject 5 mg into the vein once.   No facility-administered encounter medications on file as of 05/31/2021.    Allergies (verified) Patient has no known allergies.   History: Past Medical History:  Diagnosis Date   Atrophic vaginitis    BRCA negative    Breast cancer ([place] 2003   Left side, Stage I   Collar bone fracture    Osteoporosis 12/2018   T score -3.1   Past Surgical History:  Procedure Laterality Date   BREAST SURGERY  2003-2004   Lumpectomy-Lymph nodes-Port a cath-Radiation and Chemo   CESAREAN SECTION     X 2   HERNIA REPAIR     TOTAL HIP ARTHROPLASTY     Left   TUBAL LIGATION     Family History   Problem Relation Age of Onset   Hypertension Mother    Breast cancer Mother        Age 11051  Heart disease Father    Alcohol abuse Father    Breast cancer Sister        Age 11013  Cancer Maternal Grandfather        Colon cancer   Heart disease Maternal Grandfather    Diabetes Paternal Grandmother    Social History   Socioeconomic History   Marital status: Married    Spouse name: Not on file   Number of children: 2   Years of education: Not on file   Highest education level: Not on file  Occupational History    Employer: [employer]  Tobacco Use   Smoking status: Never   Smokeless tobacco: Never  Vaping Use   Vaping Use: Never used  Substance and Sexual Activity   Alcohol use: Yes    Alcohol/week: 0.0 standard drinks    Comment: very rare   Drug use: No   Sexual activity: Yes    Birth control/protection: Surgical    Comment: BTL-1st intercourse 25yo-1 partner  Other Topics Concern   Not on file  Social History Narrative   Not on file   Social Determinants of Health   Financial Resource Strain: Low Risk    Difficulty  of Paying Living Expenses: Not hard at all  Food Insecurity: No Food Insecurity   Worried About [place] in the Last Year: Never true   [place] in the Last Year: Never true  Transportation Needs: No Transportation Needs   Lack of Transportation (Medical): No   Lack of Transportation (Non-Medical): No  Physical Activity: Sufficiently Active   Days of Exercise per Week: 5 days   Minutes of Exercise per Session: 30 min  Stress: No Stress Concern Present   Feeling of Stress : Not at all  Social Connections: Unknown   Frequency of Communication with Friends and Family: More than three times a week   Frequency of Social Gatherings with Friends and Family: More than three times a week   Attends Religious Services: Not on Electrical engineer or Organizations: Not on file   Attends Archivist Meetings: Not  on file   Marital Status: Not on file    Tobacco Counseling Counseling given: Not Answered   Clinical Intake:  Pre-visit preparation completed: Yes        Diabetes: No  How often do you need to have someone help you when you read instructions, pamphlets, or other written materials from your doctor or pharmacy?: 1 - Never    Interpreter Needed?: No      Activities of Daily Living In your present state of health, do you have any difficulty performing the following activities: 05/31/2021  Hearing? N  Vision? N  Difficulty concentrating or making decisions? N  Walking or climbing stairs? N  Dressing or bathing? N  Doing errands, shopping? N  Preparing Food and eating ? N  Using the Toilet? N  In the past six months, have you accidently leaked urine? N  Do you have problems with loss of bowel control? N  Managing your Medications? N  Managing your Finances? N  Housekeeping or managing your Housekeeping? N  Some recent data might be hidden   Patient Care Team: Einar Pheasant, MD as PCP - General (Unknown Physician Specialty)  Indicate any recent Medical Services you may have received from other than [facility] providers in the past year (date may be approximate).     Assessment:   This is a routine wellness examination for Tanya Fox.  Virtual Visit via Telephone Note  I connected with  Tanya Fox on 05/31/21 at 12:30 PM EST by telephone and verified that I am speaking with the correct person using two identifiers.  Persons participating in the virtual visit: patient/Nurse Health Advisor   I discussed the limitations, risks, security and privacy concerns of performing an evaluation and management service by telephone and the availability of in person appointments. The patient expressed understanding and agreed to proceed.  Interactive audio and video telecommunications were attempted between this nurse and patient, however failed, due to patient having technical  difficulties OR patient did not have access to video capability.  We continued and completed visit with audio only.  Some vital signs may be absent or patient reported.   Hearing/Vision screen Hearing Screening - Comments:: Patient is able to hear conversational tones without difficulty.  No issues reported. Vision Screening - Comments:: Wears corrective lenses They have seen their ophthalmologist in the last 12 months.   Dietary issues and exercise activities discussed: Current Exercise Habits: Home exercise routine, Type of exercise: walking;strength training/weights;yoga, Time (Minutes): 30, Frequency (Times/Week): 5, Weekly Exercise (Minutes/Week): 150, Intensity: Mild Healthy diet Good water  intake   Goals Addressed             This Visit's Progress    Follow up with Primary Care Provider       As needed.       Depression Screen PHQ 2/9 Scores 05/31/2021 05/30/2020 10/14/2017 02/27/2016 08/23/2015  PHQ - 2 Score 0 0 0 0 0    Fall Risk Fall Risk  05/31/2021 05/30/2020 02/18/2020 02/27/2016 08/23/2015  Falls in the past year? 0 0 0 No No  Number falls in past yr: 0 0 - - -  Injury with Fall? - 0 - - -  Follow up Falls evaluation completed Falls evaluation completed Falls evaluation completed - -   FALL RISK PREVENTION PERTAINING TO THE HOME: Home free of loose throw rugs in walkways, pet beds, electrical cords, etc? Yes  Adequate lighting in your home to reduce risk of falls? Yes   ASSISTIVE DEVICES UTILIZED TO PREVENT FALLS: Life alert? No  Use of a cane, walker or w/c? No   TIMED UP AND GO: Was the test performed? No .   Cognitive Function:  Patient is alert and oriented x3.    6CIT Screen 05/31/2021  Repeat phrase 0 points    Immunizations Immunization History  Administered Date(s) Administered   Influenza Split 02/01/2014   Influenza, High Dose Seasonal PF 02/03/2018, 01/12/2021   Influenza,inj,Quad PF,6+ Mos 01/15/2017   Influenza-Unspecified 01/31/2014,  01/04/2020   PFIZER(Purple Top)SARS-COV-2 Vaccination 05/15/2019, 06/06/2019, 01/31/2020, 12/12/2020   PPD Test 03/16/2012, 03/09/2013, 03/14/2014   Pneumococcal Conjugate-13 11/17/2018   Pneumococcal Polysaccharide-23 02/18/2020   Tdap 01/01/2011, 12/22/2019   Zoster Recombinat (Shingrix) 11/18/2018, 01/27/2019   Screening Tests Health Maintenance  Topic Date Due   COVID-19 Vaccine (5 - Booster for [place] series) 06/16/2021 (Originally 02/06/2021)   PAP SMEAR-Modifier  04/27/2022   COLONOSCOPY (Pts 45-14yr Insurance coverage will need to be confirmed)  07/31/2022   MAMMOGRAM  01/05/2023   TETANUS/TDAP  12/21/2029   Pneumonia Vaccine 69 Years old  Completed   INFLUENZA VACCINE  Completed   DEXA SCAN  Completed   Hepatitis C Screening  Completed   Zoster Vaccines- Shingrix  Completed   HPV VACCINES  Aged Out   Health Maintenance There are no preventive care reminders to display for this patient.  Lung Cancer Screening: (Low Dose CT Chest recommended if Age 69-80years, 30 pack-year currently smoking OR have quit w/in 15years.) does not qualify.   Vision Screening: Recommended annual ophthalmology exams for early detection of glaucoma and other disorders of the eye.  Dental Screening: Recommended annual dental exams for proper oral hygiene  Community Resource Referral / Chronic Care Management: CRR required this visit?  No   CCM required this visit?  No      Plan:   Keep all routine maintenance appointments.   I have personally reviewed and noted the following in the patients chart:   Medical and social history Use of alcohol, tobacco or illicit drugs  Current medications and supplements including opioid prescriptions.  Functional ability and status Nutritional status Physical activity Advanced directives List of other physicians Hospitalizations, surgeries, and ER visits in previous 12 months Vitals Screenings to include cognitive, depression, and  falls Referrals and appointments  In addition, I have reviewed and discussed with patient certain preventive protocols, quality metrics, and best practice recommendations. A written personalized care plan for preventive services as well as general preventive health recommendations were provided to patient via mychart.     Tanya Fox  Tanya Attaway L, LPN   [id number]

## 2021-05-31 NOTE — Patient Instructions (Addendum)
Tanya Fox , Thank you for taking time to come for your Medicare Wellness Visit. I appreciate your ongoing commitment to your health goals. Please review the following plan we discussed and let me know if I can assist you in the future.   These are the goals we discussed:  Goals      Follow up with Primary Care Provider     As needed.        This is a list of the screening recommended for you and due dates:  Health Maintenance  Topic Date Due   COVID-19 Vaccine (5 - Booster for Pfizer series) 06/16/2021*   Pap Smear  04/27/2022   Colon Cancer Screening  07/31/2022   Mammogram  01/05/2023   Tetanus Vaccine  12/21/2029   Pneumonia Vaccine  Completed   Flu Shot  Completed   DEXA scan (bone density measurement)  Completed   Hepatitis C Screening: USPSTF Recommendation to screen - Ages 47-79 yo.  Completed   Zoster (Shingles) Vaccine  Completed   HPV Vaccine  Aged Out  *Topic was postponed. The date shown is not the original due date.    Advanced directives: not yet completed  Conditions/risks identified: none new  Follow up in one year for your annual wellness visit    Preventive Care 65 Years and Older, Female Preventive care refers to lifestyle choices and visits with your health care provider that can promote health and wellness. What does preventive care include? A yearly physical exam. This is also called an annual well check. Dental exams once or twice a year. Routine eye exams. Ask your health care provider how often you should have your eyes checked. Personal lifestyle choices, including: Daily care of your teeth and gums. Regular physical activity. Eating a healthy diet. Avoiding tobacco and drug use. Limiting alcohol use. Practicing safe sex. Taking low-dose aspirin every day. Taking vitamin and mineral supplements as recommended by your health care provider. What happens during an annual well check? The services and screenings done by your health care  provider during your annual well check will depend on your age, overall health, lifestyle risk factors, and family history of disease. Counseling  Your health care provider may ask you questions about your: Alcohol use. Tobacco use. Drug use. Emotional well-being. Home and relationship well-being. Sexual activity. Eating habits. History of falls. Memory and ability to understand (cognition). Work and work Statistician. Reproductive health. Screening  You may have the following tests or measurements: Height, weight, and BMI. Blood pressure. Lipid and cholesterol levels. These may be checked every 5 years, or more frequently if you are over 59 years old. Skin check. Lung cancer screening. You may have this screening every year starting at age 78 if you have a 30-pack-year history of smoking and currently smoke or have quit within the past 15 years. Fecal occult blood test (FOBT) of the stool. You may have this test every year starting at age 51. Flexible sigmoidoscopy or colonoscopy. You may have a sigmoidoscopy every 5 years or a colonoscopy every 10 years starting at age 41. Hepatitis C blood test. Hepatitis B blood test. Sexually transmitted disease (STD) testing. Diabetes screening. This is done by checking your blood sugar (glucose) after you have not eaten for a while (fasting). You may have this done every 1-3 years. Bone density scan. This is done to screen for osteoporosis. You may have this done starting at age 53. Mammogram. This may be done every 1-2 years. Talk to your health  care provider about how often you should have regular mammograms. Talk with your health care provider about your test results, treatment options, and if necessary, the need for more tests. Vaccines  Your health care provider may recommend certain vaccines, such as: Influenza vaccine. This is recommended every year. Tetanus, diphtheria, and acellular pertussis (Tdap, Td) vaccine. You may need a Td  booster every 10 years. Zoster vaccine. You may need this after age 12. Pneumococcal 13-valent conjugate (PCV13) vaccine. One dose is recommended after age 43. Pneumococcal polysaccharide (PPSV23) vaccine. One dose is recommended after age 69. Talk to your health care provider about which screenings and vaccines you need and how often you need them. This information is not intended to replace advice given to you by your health care provider. Make sure you discuss any questions you have with your health care provider. Document Released: 04/28/2015 Document Revised: 12/20/2015 Document Reviewed: 01/31/2015 Elsevier Interactive Patient Education  2017 [place] Prevention in the Home Falls can cause injuries. They can happen to people of all ages. There are many things you can do to make your home safe and to help prevent falls. What can I do on the outside of my home? Regularly fix the edges of walkways and driveways and fix any cracks. Remove anything that might make you trip as you walk through a door, such as a raised step or threshold. Trim any bushes or trees on the path to your home. Use bright outdoor lighting. Clear any walking paths of anything that might make someone trip, such as rocks or tools. Regularly check to see if handrails are loose or broken. Make sure that both sides of any steps have handrails. Any raised decks and porches should have guardrails on the edges. Have any leaves, snow, or ice cleared regularly. Use sand or salt on walking paths during winter. Clean up any spills in your garage right away. This includes oil or grease spills. What can I do in the bathroom? Use night lights. Install grab bars by the toilet and in the tub and shower. Do not use towel bars as grab bars. Use non-skid mats or decals in the tub or shower. If you need to sit down in the shower, use a plastic, non-slip stool. Keep the floor dry. Clean up any water that spills on the floor  as soon as it happens. Remove soap buildup in the tub or shower regularly. Attach bath mats securely with double-sided non-slip rug tape. Do not have throw rugs and other things on the floor that can make you trip. What can I do in the bedroom? Use night lights. Make sure that you have a light by your bed that is easy to reach. Do not use any sheets or blankets that are too big for your bed. They should not hang down onto the floor. Have a firm chair that has side arms. You can use this for support while you get dressed. Do not have throw rugs and other things on the floor that can make you trip. What can I do in the kitchen? Clean up any spills right away. Avoid walking on wet floors. Keep items that you use a lot in easy-to-reach places. If you need to reach something above you, use a strong step stool that has a grab bar. Keep electrical cords out of the way. Do not use floor polish or wax that makes floors slippery. If you must use wax, use non-skid floor wax. Do not have throw  rugs and other things on the floor that can make you trip. What can I do with my stairs? Do not leave any items on the stairs. Make sure that there are handrails on both sides of the stairs and use them. Fix handrails that are broken or loose. Make sure that handrails are as long as the stairways. Check any carpeting to make sure that it is firmly attached to the stairs. Fix any carpet that is loose or worn. Avoid having throw rugs at the top or bottom of the stairs. If you do have throw rugs, attach them to the floor with carpet tape. Make sure that you have a light switch at the top of the stairs and the bottom of the stairs. If you do not have them, ask someone to add them for you. What else can I do to help prevent falls? Wear shoes that: Do not have high heels. Have rubber bottoms. Are comfortable and fit you well. Are closed at the toe. Do not wear sandals. If you use a stepladder: Make sure that it is  fully opened. Do not climb a closed stepladder. Make sure that both sides of the stepladder are locked into place. Ask someone to hold it for you, if possible. Clearly mark and make sure that you can see: Any grab bars or handrails. First and last steps. Where the edge of each step is. Use tools that help you move around (mobility aids) if they are needed. These include: Canes. Walkers. Scooters. Crutches. Turn on the lights when you go into a dark area. Replace any light bulbs as soon as they burn out. Set up your furniture so you have a clear path. Avoid moving your furniture around. If any of your floors are uneven, fix them. If there are any pets around you, be aware of where they are. Review your medicines with your doctor. Some medicines can make you feel dizzy. This can increase your chance of falling. Ask your doctor what other things that you can do to help prevent falls. This information is not intended to replace advice given to you by your health care provider. Make sure you discuss any questions you have with your health care provider. Document Released: 01/26/2009 Document Revised: 09/07/2015 Document Reviewed: 05/06/2014 Elsevier Interactive Patient Education  2017 [employer].

## 2021-06-06 ENCOUNTER — Other Ambulatory Visit: Payer: Self-pay

## 2021-06-06 ENCOUNTER — Inpatient Hospital Stay: Payer: Medicare HMO | Attending: Oncology | Admitting: Oncology

## 2021-06-06 ENCOUNTER — Inpatient Hospital Stay: Payer: Medicare HMO

## 2021-06-06 VITALS — BP 115/74 | HR 81 | Temp 97.8°F | Resp 20 | Ht 65.0 in | Wt 113.4 lb

## 2021-06-06 DIAGNOSIS — M810 Age-related osteoporosis without current pathological fracture: Secondary | ICD-10-CM | POA: Insufficient documentation

## 2021-06-06 DIAGNOSIS — Z853 Personal history of malignant neoplasm of breast: Secondary | ICD-10-CM | POA: Diagnosis not present

## 2021-06-06 DIAGNOSIS — R197 Diarrhea, unspecified: Secondary | ICD-10-CM | POA: Insufficient documentation

## 2021-06-06 DIAGNOSIS — D72819 Decreased white blood cell count, unspecified: Secondary | ICD-10-CM | POA: Diagnosis not present

## 2021-06-06 LAB — CBC WITH DIFFERENTIAL (CANCER CENTER ONLY)
Abs Immature Granulocytes: 0 10*3/uL (ref 0.00–0.07)
Basophils Absolute: 0.1 10*3/uL (ref 0.0–0.1)
Basophils Relative: 1 %
Eosinophils Absolute: 0.2 10*3/uL (ref 0.0–0.5)
Eosinophils Relative: 4 %
HCT: 39.3 % (ref 36.0–46.0)
Hemoglobin: 13.1 g/dL (ref 12.0–15.0)
Immature Granulocytes: 0 %
Lymphocytes Relative: 36 %
Lymphs Abs: 1.5 10*3/uL (ref 0.7–4.0)
MCH: 31.6 pg (ref 26.0–34.0)
MCHC: 33.3 g/dL (ref 30.0–36.0)
MCV: 94.7 fL (ref 80.0–100.0)
Monocytes Absolute: 0.4 10*3/uL (ref 0.1–1.0)
Monocytes Relative: 9 %
Neutro Abs: 2.2 10*3/uL (ref 1.7–7.7)
Neutrophils Relative %: 50 %
Platelet Count: 221 10*3/uL (ref 150–400)
RBC: 4.15 MIL/uL (ref 3.87–5.11)
RDW: 13.3 % (ref 11.5–15.5)
WBC Count: 4.3 10*3/uL (ref 4.0–10.5)
nRBC: 0 % (ref 0.0–0.2)

## 2021-06-06 LAB — LACTATE DEHYDROGENASE: LDH: 129 U/L (ref 98–192)

## 2021-06-06 LAB — SAVE SMEAR(SSMR), FOR PROVIDER SLIDE REVIEW

## 2021-06-06 NOTE — Progress Notes (Addendum)
[facility] New Patient Consult   Requesting MD: Einar Pheasant, [address] [place],  [place] [address]   Tanya Fox 71 y.o.  02-21-1951    Reason for Consult: Leukopenia, serum M spike   HPI: Tanya Fox has a remote history of breast cancer last seen at the Cancer center in 2009.  She is followed by Tanya Fox and gynecology for breast cancer surveillance.  She is being treated for osteoporosis. 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  A CBC on 05/02/2021 found a white count of 3.5, hemoglobin 13.4, MCV 94.8, platelets 235,000, and the ANC returned at 1.2.  Review of the medical record reveals a white count of 2.8 with an ANC of 1.1 in December 2013. A repeat CBC on 05/24/2021 found the white count at 3.8 with an [place] of 1.7.  A serum protein electrophoresis on 05/02/2021 revealed a 0.4 g M spike.  Tanya Fox feels well.  No recent infection.  No pain.  She underwent a left inguinal hernia repair on 06/04/2021.  Past Medical History:  Diagnosis Date   Atrophic vaginitis    BRCA negative    Breast cancer ([place]) 2003   Left side, Stage I   Collar bone fracture    Osteoporosis 12/2018   T score -3.1    Past Surgical History:  Procedure Laterality Date   BREAST SURGERY  2003-2004   Lumpectomy-Lymph nodes-Port a cath-Radiation and Chemo   CESAREAN SECTION     X 2   HERNIA REPAIR     TOTAL HIP ARTHROPLASTY     Left   TUBAL LIGATION      Medications: Reviewed  Allergies: No Known Allergies  Family history: Tanya Fox and Fox had breast cancer.  Social History:   She is a retired Pharmacist, hospital.  She lives with Tanya husband in [place].  She does not use cigarettes or alcohol.  No risk factor for HIV or hepatitis.  No transfusion history.  ROS:   Positives include: Chronic diarrhea-worse for the past year  A complete ROS was otherwise negative.  Physical Exam:  Blood pressure 115/74, pulse 81, temperature 97.8 F  (36.6 C), temperature source Oral, resp. rate 20, height _0  (1.651 m), weight 113 lb 6.4 oz (51.4 kg), SpO2 98 %.  HEENT: Neck without mass Lungs: Clear bilaterally Cardiac: Regular rate and rhythm Abdomen: No hepatosplenomegaly, no mass, nontender  Vascular: No leg edema Lymph nodes: No cervical, supraclavicular, axillary, or inguinal nodes Neurologic: Alert and oriented, motor exam appears intact in the upper and lower extremities bilaterally Skin: No rash, healing surgical incisions at the abdomen Musculoskeletal: No spine tenderness   LAB:  CBC  Lab Results  Component Value Date   WBC 3.8 (L) 05/24/2021   HGB 13.3 05/24/2021   HCT 39.5 05/24/2021   MCV 95.0 05/24/2021   PLT 235.0 05/24/2021   NEUTROABS 1.7 05/24/2021    Blood smear: The platelets appear normal in number, no platelet clumps.  The majority the white cells are mature neutrophils and lymphocytes.  No blasts or other young forms are seen.  Few ovalocytes, the Red cell morphology is otherwise unremarkable.  The polychromasia is not increased.    CMP  Lab Results  Component Value Date   NA 141 05/02/2021   K 4.3 05/02/2021   CL 105 05/02/2021   CO2 31 05/02/2021   GLUCOSE 68 (L) 05/02/2021   BUN 16 05/02/2021   CREATININE 0.57 05/02/2021   CALCIUM  8.7 05/02/2021   PROT 6.5 05/02/2021   PROT 6.6 05/02/2021   ALBUMIN 4.1 05/02/2021   AST 23 05/02/2021   ALT 16 05/02/2021   ALKPHOS 65 05/02/2021   BILITOT 0.6 05/02/2021     Assessment/Plan:   Leukopenia/neutropenia  Serum M spike  3.   Stage I left-sided breast cancer December 2003, adjuvant [facility] followed by radiation and 5 years of anastrozole  4.   Osteoporosis  5.   Left inguinal hernia repair 06/04/2021   Disposition:   Tanya Fox is referred for evaluation of leukopenia and a low level serum M spike.  I suspect the leukopenia is a normal variant. 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  There  is no clinical evidence for a lymphoproliferative disorder or multiple myeloma.  We will perform additional confirmatory testing including a serum immunofixation, quantitative immunoglobulins, and serum free light chain analysis.  We will repeat a CBC and I will review the peripheral blood smear today.  We will follow-up on results from the above diagnostic evaluation.  She will be scheduled for a follow-up visit and repeat labs in 6 months.   Tanya Coder, MD  06/06/2021, 2:46 PM

## 2021-06-07 LAB — KAPPA/LAMBDA LIGHT CHAINS
Kappa free light chain: 14.5 mg/L (ref 3.3–19.4)
Kappa, lambda light chain ratio: 1.48 (ref 0.26–1.65)
Lambda free light chains: 9.8 mg/L (ref 5.7–26.3)

## 2021-06-08 ENCOUNTER — Telehealth: Payer: Self-pay | Admitting: *Deleted

## 2021-06-08 NOTE — Telephone Encounter (Signed)
Notified patient of normal WBC and light chains. She will be called when myeloma panel results.

## 2021-06-08 NOTE — Telephone Encounter (Signed)
-----   Message from Ladell Pier, MD sent at 06/07/2021  5:14 PM EST ----- Please call patient, the white blood count is normal today, waiting on results of the myeloma panel, follow-up as scheduled

## 2021-06-12 LAB — MULTIPLE MYELOMA PANEL, SERUM
Albumin SerPl Elph-Mcnc: 3.9 g/dL (ref 2.9–4.4)
Albumin/Glob SerPl: 1.4 (ref 0.7–1.7)
Alpha 1: 0.2 g/dL (ref 0.0–0.4)
Alpha2 Glob SerPl Elph-Mcnc: 0.7 g/dL (ref 0.4–1.0)
B-Globulin SerPl Elph-Mcnc: 0.9 g/dL (ref 0.7–1.3)
Gamma Glob SerPl Elph-Mcnc: 1 g/dL (ref 0.4–1.8)
Globulin, Total: 2.9 g/dL (ref 2.2–3.9)
IgA: 107 mg/dL (ref 87–352)
IgG (Immunoglobin G), Serum: 996 mg/dL (ref 586–1602)
IgM (Immunoglobulin M), Srm: 163 mg/dL (ref 26–217)
M Protein SerPl Elph-Mcnc: 0.3 g/dL — ABNORMAL HIGH
Total Protein ELP: 6.8 g/dL (ref 6.0–8.5)

## 2021-06-13 ENCOUNTER — Telehealth: Payer: Self-pay | Admitting: *Deleted

## 2021-06-13 NOTE — Telephone Encounter (Signed)
Left VM to check Mychart of explanation of labs per Dr. Benay Spice. ?

## 2021-06-13 NOTE — Telephone Encounter (Signed)
Light chains and myeloma panel have resulted and she is asking for Dr. Benay Spice to interpret results for her and what this means. ? ?

## 2021-06-27 DIAGNOSIS — H25813 Combined forms of age-related cataract, bilateral: Secondary | ICD-10-CM | POA: Diagnosis not present

## 2021-06-27 DIAGNOSIS — H35371 Puckering of macula, right eye: Secondary | ICD-10-CM | POA: Diagnosis not present

## 2021-06-27 DIAGNOSIS — H16223 Keratoconjunctivitis sicca, not specified as Sjogren's, bilateral: Secondary | ICD-10-CM | POA: Diagnosis not present

## 2021-07-06 DIAGNOSIS — Z538 Procedure and treatment not carried out for other reasons: Secondary | ICD-10-CM | POA: Diagnosis not present

## 2021-07-06 DIAGNOSIS — Z1211 Encounter for screening for malignant neoplasm of colon: Secondary | ICD-10-CM | POA: Diagnosis not present

## 2021-07-06 DIAGNOSIS — R197 Diarrhea, unspecified: Secondary | ICD-10-CM | POA: Diagnosis not present

## 2021-07-19 DIAGNOSIS — N3289 Other specified disorders of bladder: Secondary | ICD-10-CM | POA: Diagnosis not present

## 2021-07-19 DIAGNOSIS — Z1211 Encounter for screening for malignant neoplasm of colon: Secondary | ICD-10-CM | POA: Diagnosis not present

## 2021-07-19 DIAGNOSIS — N289 Disorder of kidney and ureter, unspecified: Secondary | ICD-10-CM | POA: Diagnosis not present

## 2021-09-20 DIAGNOSIS — H25813 Combined forms of age-related cataract, bilateral: Secondary | ICD-10-CM | POA: Diagnosis not present

## 2021-09-20 DIAGNOSIS — H35371 Puckering of macula, right eye: Secondary | ICD-10-CM | POA: Diagnosis not present

## 2021-09-20 DIAGNOSIS — M7061 Trochanteric bursitis, right hip: Secondary | ICD-10-CM | POA: Diagnosis not present

## 2021-09-20 DIAGNOSIS — H16223 Keratoconjunctivitis sicca, not specified as Sjogren's, bilateral: Secondary | ICD-10-CM | POA: Diagnosis not present

## 2021-10-18 DIAGNOSIS — K580 Irritable bowel syndrome with diarrhea: Secondary | ICD-10-CM | POA: Diagnosis not present

## 2021-11-07 ENCOUNTER — Ambulatory Visit
Admission: RE | Admit: 2021-11-07 | Discharge: 2021-11-07 | Disposition: A | Discharge status: Home / Self Care | Payer: Medicare HMO | Source: Ambulatory Visit | Attending: Family Medicine | Admitting: Family Medicine

## 2021-11-07 ENCOUNTER — Ambulatory Visit (INDEPENDENT_AMBULATORY_CARE_PROVIDER_SITE_OTHER): Payer: Medicare HMO

## 2021-11-07 ENCOUNTER — Other Ambulatory Visit: Payer: Medicare HMO

## 2021-11-07 VITALS — BP 121/79 | HR 71 | Temp 98.3°F | Resp 18

## 2021-11-07 DIAGNOSIS — R509 Fever, unspecified: Secondary | ICD-10-CM | POA: Diagnosis not present

## 2021-11-07 DIAGNOSIS — J019 Acute sinusitis, unspecified: Secondary | ICD-10-CM

## 2021-11-07 DIAGNOSIS — J22 Unspecified acute lower respiratory infection: Secondary | ICD-10-CM

## 2021-11-07 MED ORDER — PROMETHAZINE-DM 6.25-15 MG/5ML PO SYRP: 5.0000 mL | ORAL_SOLUTION | Freq: Three times a day (TID) | ORAL | 0 refills | Status: DC | PRN

## 2021-11-07 MED ORDER — AMOXICILLIN-POT CLAVULANATE 875-125 MG PO TABS: 1.0000 | ORAL_TABLET | Freq: Two times a day (BID) | ORAL | 0 refills | Status: AC

## 2021-11-07 MED ORDER — PREDNISONE 20 MG PO TABS: 40.0000 mg | ORAL_TABLET | Freq: Every day | ORAL | 0 refills | Status: DC

## 2021-11-07 NOTE — Discharge Instructions (Addendum)
Chest x-ray shows no pneumonia. As discussed treating you for both bronchitis and sinus infections. Take all medications as prescribed. Given recent fever I do recommend taking COVID test is negative today repeat in 2 days to ensure this is not the source of your overall illness.

## 2021-11-07 NOTE — ED Triage Notes (Signed)
Pt presents with complaints of cough, fever (102), and nausea off and on since 7/17. Reports her grandson has been sick and she has had them.

## 2021-11-07 NOTE — ED Provider Notes (Signed)
Tanya Fox    CSN: [id number] Arrival date & time: 11/07/21  1027      History   Chief Complaint Chief Complaint  Patient presents with   Cough    Low grade fever cough sore throat cough down in my chest.  I got sick on Sunday 16th.  It comes and goes but this afternoon and tonight I feel sick on my stomach and headache - Entered by patient   Appointment    HPI Tanya Fox is a 71 y.o. female.   HPI Patient presents today for evaluation prior 8 days of cough, nasal drainage, and intermittent shortness of breath.  She is concerned as the cough symptoms worsen and overnight she developed fever of 102, experienced nausea without vomiting.  Her symptoms have never completely improved and cough has gradually worsened.  She endorses some chest tightness without any wheezing or shortness of breath.  She has no history of pneumonia or any chronic lung disease.  She is currently afebrile.  Her grandson has been sick however his symptoms have resolved.  Past Medical History:  Diagnosis Date   Atrophic vaginitis    BRCA negative    Breast cancer ([place]) 2003   Left side, Stage I   Collar bone fracture    Osteoporosis 12/2018   T score -3.1    Patient Active Problem List   Diagnosis Date Noted   Osteoporosis 01/25/2020   Hernia of fascia 06/03/2018   Sleeping difficulties 06/21/2017   Left hip pain 02/28/2016   Pre-op evaluation 02/28/2016   Osteoarthritis of hip 12/27/2015   Health care maintenance 08/28/2015   Groin pain 07/11/2015   Back pain 03/06/2014   Diarrhea 03/11/2013   Leukopenia 03/05/2012   Osteopenia    History of breast cancer     Past Surgical History:  Procedure Laterality Date   BREAST SURGERY  2003-2004   Lumpectomy-Lymph nodes-Port a cath-Radiation and Chemo   CESAREAN SECTION     X 2   HERNIA REPAIR     TOTAL HIP ARTHROPLASTY     Left   TUBAL LIGATION      OB History     Gravida  2   Para  2   Term  2   Preterm      AB       Living  2      SAB      IAB      Ectopic      Multiple      Live Births               Home Medications    Prior to Admission medications   Medication Sig Start Date End Date Taking? Authorizing Provider  amoxicillin-clavulanate (AUGMENTIN) 875-125 MG tablet Take 1 tablet by mouth 2 (two) times daily for 7 days. 11/07/21 11/14/21 Yes Scot Jun, FNP  predniSONE (DELTASONE) 20 MG tablet Take 2 tablets (40 mg total) by mouth daily with breakfast. 11/07/21  Yes Scot Jun, FNP  promethazine-dextromethorphan (PROMETHAZINE-DM) 6.25-15 MG/5ML syrup Take 5 mLs by mouth 3 (three) times daily as needed for cough. 11/07/21  Yes Scot Jun, FNP  Cholecalciferol (VITAMIN D PO) Take by mouth.    [provider]  hydrOXYzine (ATARAX) 10 MG tablet TAKE 1 TO 2 TABLETS BY MOUTH AT BEDTIME AS NEEDED 05/14/21   Einar Pheasant, MD  hyoscyamine (LEVBID) 0.375 MG 12 hr tablet Take by mouth. 06/01/21   [provider]  Multiple  Vitamin (MULTIVITAMIN) tablet Take 1 tablet by mouth daily.      [provider]  zoledronic acid (RECLAST) 5 MG/100ML SOLN injection Inject 5 mg into the vein once.    [provider]    Family History Family History  Problem Relation Age of Onset   Hypertension Mother    Breast cancer Mother        Age 50   Heart disease Father    Alcohol abuse Father    Breast cancer Sister        Age 64   Cancer Maternal Grandfather        Colon cancer   Heart disease Maternal Grandfather    Diabetes Paternal Grandmother     Social History Social History   Tobacco Use   Smoking status: Never   Smokeless tobacco: Never  Vaping Use   Vaping Use: Never used  Substance Use Topics   Alcohol use: Yes    Alcohol/week: 0.0 standard drinks of alcohol    Comment: very rare   Drug use: No     Allergies   Patient has no known allergies.   Review of Systems Review of Systems Pertinent negatives listed in HPI   Physical Exam Triage Vital Signs ED Triage Vitals  Enc Vitals Group     BP 11/07/21 1033 121/79     Pulse Rate 11/07/21 1033 71     Resp 11/07/21 1033 18     Temp 11/07/21 1033 98.3 F (36.8 C)     Temp src --      SpO2 11/07/21 1033 93 %     Weight --      Height --      Head Circumference --      Peak Flow --      Pain Score 11/07/21 1032 0     Pain Loc --      Pain Edu? --      Excl. in [place]? --    No data found.  Updated Vital Signs BP 121/79   Pulse 71   Temp 98.3 F (36.8 C)   Resp 18   SpO2 93%   Visual Acuity Right Eye Distance:   Left Eye Distance:   Bilateral Distance:    Right Eye Near:   Left Eye Near:    Bilateral Near:     Physical Exam Vitals reviewed.  Constitutional:      Appearance: Normal appearance.  HENT:     Head: Normocephalic and atraumatic.     Nose: Congestion and rhinorrhea present.  Eyes:     Extraocular Movements: Extraocular movements intact.     Conjunctiva/sclera: Conjunctivae normal.     Pupils: Pupils are equal, round, and reactive to light.  Cardiovascular:     Rate and Rhythm: Normal rate and regular rhythm.  Pulmonary:     Breath sounds: Decreased air movement present. Rhonchi present.  Musculoskeletal:     Cervical back: Full passive range of motion without pain.  Lymphadenopathy:     Cervical: No cervical adenopathy.  Skin:    General: Skin is warm and dry.     Capillary Refill: Capillary refill takes less than 2 seconds.  Neurological:     General: No focal deficit present.     Mental Status: She is alert.  Psychiatric:        Mood and Affect: Mood normal.        Behavior: Behavior normal. Behavior is cooperative.      UC Treatments /  Results  Labs (all labs ordered are listed, but only abnormal results are displayed) Labs Reviewed - No data to display  EKG   Radiology DG Chest 2 View  Result Date: 11/07/2021 CLINICAL DATA:  Fever and cough since July 17, low oxygen levels concern for pneumonia.  Chest tightness and cough times 10 days. EXAM: CHEST - 2 VIEW COMPARISON:  No recent relevant priors available for comparison at time dictation. FINDINGS: The heart size and mediastinal contours are within normal limits. Chronic bronchitic lung changes. No focal airspace consolidation. No pleural effusion. No pneumothorax. The visualized skeletal structures are unremarkable. IMPRESSION: No acute cardiopulmonary disease. Electronically Signed   By: Dahlia Bailiff M.D.   On: 11/07/2021 11:15    Procedures Procedures (including critical care time)  Medications Ordered in UC Medications - No data to display  Initial Impression / Assessment and Plan / UC Course  I have reviewed the triage vital signs and the nursing notes.  Pertinent labs & imaging results that were available during my care of the patient were reviewed by me and considered in my medical decision making (see chart for details).    Chest x-ray negative for pneumonia although consistent with bronchitis. Treatment per discharge recommendations and medication orders. Given new fever overnight, recommended COVID test (she has multiple home test). ER if symptoms worsen or follow-up with PCP if symptoms do not readily resolve. Final Clinical Impressions(s) / UC Diagnoses   Final diagnoses:  Lower respiratory infection  Acute non-recurrent sinusitis, unspecified location     Discharge Instructions      Chest x-ray shows no pneumonia. As discussed treating you for both bronchitis and sinus infections. Take all medications as prescribed. Given recent fever I do recommend taking COVID test is negative today repeat in 2 days to ensure this is not the source of your overall illness.     ED Prescriptions     Medication Sig Dispense Auth. Provider   predniSONE (DELTASONE) 20 MG tablet Take 2 tablets (40 mg total) by mouth daily with breakfast. 10 tablet Scot Jun, FNP   promethazine-dextromethorphan (PROMETHAZINE-DM) 6.25-15  MG/5ML syrup Take 5 mLs by mouth 3 (three) times daily as needed for cough. 180 mL Scot Jun, FNP   amoxicillin-clavulanate (AUGMENTIN) 875-125 MG tablet Take 1 tablet by mouth 2 (two) times daily for 7 days. 14 tablet Scot Jun, FNP      PDMP not reviewed this encounter.   Scot Jun, FNP 11/07/21 1139

## 2021-11-08 ENCOUNTER — Other Ambulatory Visit: Payer: Self-pay | Admitting: Internal Medicine

## 2021-11-23 DIAGNOSIS — H25811 Combined forms of age-related cataract, right eye: Secondary | ICD-10-CM | POA: Diagnosis not present

## 2021-12-04 ENCOUNTER — Encounter: Payer: Self-pay | Admitting: Internal Medicine

## 2021-12-04 ENCOUNTER — Inpatient Hospital Stay: Payer: Medicare HMO | Admitting: Oncology

## 2021-12-04 ENCOUNTER — Inpatient Hospital Stay: Payer: Medicare HMO | Attending: Oncology

## 2021-12-04 VITALS — BP 113/71 | HR 69 | Temp 98.1°F | Resp 20 | Ht 65.0 in | Wt 115.0 lb

## 2021-12-04 DIAGNOSIS — M810 Age-related osteoporosis without current pathological fracture: Secondary | ICD-10-CM | POA: Insufficient documentation

## 2021-12-04 DIAGNOSIS — Z79899 Other long term (current) drug therapy: Secondary | ICD-10-CM | POA: Insufficient documentation

## 2021-12-04 DIAGNOSIS — D72819 Decreased white blood cell count, unspecified: Secondary | ICD-10-CM | POA: Insufficient documentation

## 2021-12-04 DIAGNOSIS — D472 Monoclonal gammopathy: Secondary | ICD-10-CM | POA: Insufficient documentation

## 2021-12-04 DIAGNOSIS — Z853 Personal history of malignant neoplasm of breast: Secondary | ICD-10-CM | POA: Insufficient documentation

## 2021-12-04 LAB — CBC WITH DIFFERENTIAL (CANCER CENTER ONLY)
Abs Immature Granulocytes: 0 10*3/uL (ref 0.00–0.07)
Basophils Absolute: 0.1 10*3/uL (ref 0.0–0.1)
Basophils Relative: 1 %
Eosinophils Absolute: 0.3 10*3/uL (ref 0.0–0.5)
Eosinophils Relative: 8 %
HCT: 38 % (ref 36.0–46.0)
Hemoglobin: 12.8 g/dL (ref 12.0–15.0)
Immature Granulocytes: 0 %
Lymphocytes Relative: 43 %
Lymphs Abs: 1.5 10*3/uL (ref 0.7–4.0)
MCH: 31.8 pg (ref 26.0–34.0)
MCHC: 33.7 g/dL (ref 30.0–36.0)
MCV: 94.3 fL (ref 80.0–100.0)
Monocytes Absolute: 0.4 10*3/uL (ref 0.1–1.0)
Monocytes Relative: 11 %
Neutro Abs: 1.3 10*3/uL — ABNORMAL LOW (ref 1.7–7.7)
Neutrophils Relative %: 37 %
Platelet Count: 176 10*3/uL (ref 150–400)
RBC: 4.03 MIL/uL (ref 3.87–5.11)
RDW: 13.2 % (ref 11.5–15.5)
WBC Count: 3.6 10*3/uL — ABNORMAL LOW (ref 4.0–10.5)
nRBC: 0 % (ref 0.0–0.2)

## 2021-12-04 NOTE — Progress Notes (Signed)
  [facility] OFFICE PROGRESS NOTE   Diagnosis: Breast cancer, serum monoclonal protein  INTERVAL HISTORY:   Tanya Fox returns as scheduled.  She feels well.  No pain.  She and multiple family members had an upper respiratory infection in July.  Objective:  Vital signs in last 24 hours:  Blood pressure 113/71, pulse 69, temperature 98.1 F (36.7 C), temperature source Oral, resp. rate 20, height _0  (1.651 m), weight 115 lb (52.2 kg), SpO2 100 %.     Lymphatics: No cervical, supraclavicular, axillary, or inguinal nodes Resp: Lungs clear bilaterally Cardio: Regular rate and rhythm GI: No hepatosplenomegaly Vascular: No leg edema  Lab Results:  Lab Results  Component Value Date   WBC 3.6 (L) 12/04/2021   HGB 12.8 12/04/2021   HCT 38.0 12/04/2021   MCV 94.3 12/04/2021   PLT 176 12/04/2021   NEUTROABS 1.3 (L) 12/04/2021    CMP  Lab Results  Component Value Date   NA 141 05/02/2021   K 4.3 05/02/2021   CL 105 05/02/2021   CO2 31 05/02/2021   GLUCOSE 68 (L) 05/02/2021   BUN 16 05/02/2021   CREATININE 0.57 05/02/2021   CALCIUM 8.7 05/02/2021   PROT 6.5 05/02/2021   PROT 6.6 05/02/2021   ALBUMIN 4.1 05/02/2021   AST 23 05/02/2021   ALT 16 05/02/2021   ALKPHOS 65 05/02/2021   BILITOT 0.6 05/02/2021     Medications: I have reviewed the patient's current medications.   Assessment/Plan: Leukopenia/neutropenia  Serum M spike-IgG kappa Low level serum M spike 06/06/2021 Normal immunoglobin levels 06/06/2021  3.   Stage I left-sided breast cancer December 2003, adjuvant [facility] followed by radiation and 5 years of anastrozole  4.   Osteoporosis  5.   Left inguinal hernia repair 06/04/2021    Disposition: Tanya Fox has a serum monoclonal IgG kappa protein.  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  There is no clinical evidence for progression to multiple myeloma or another lymphoproliferative disorder.  She  has mild intermittent neutropenia for years.  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  She will stay up-to-date on influenza, COVID-19, and pneumonia vaccines.  We will follow-up on the serum M spike from today.  Tanya Fox return for an office visit in 6 months.  [name], MD  12/04/2021  11:01 AM

## 2021-12-06 DIAGNOSIS — H2512 Age-related nuclear cataract, left eye: Secondary | ICD-10-CM | POA: Diagnosis not present

## 2021-12-06 LAB — PROTEIN ELECTROPHORESIS, SERUM
A/G Ratio: 1.5 (ref 0.7–1.7)
Albumin ELP: 3.7 g/dL (ref 2.9–4.4)
Alpha-1-Globulin: 0.2 g/dL (ref 0.0–0.4)
Alpha-2-Globulin: 0.6 g/dL (ref 0.4–1.0)
Beta Globulin: 0.7 g/dL (ref 0.7–1.3)
Gamma Globulin: 0.9 g/dL (ref 0.4–1.8)
Globulin, Total: 2.4 g/dL (ref 2.2–3.9)
M-Spike, %: 0.4 g/dL — ABNORMAL HIGH
Total Protein ELP: 6.1 g/dL (ref 6.0–8.5)

## 2021-12-07 ENCOUNTER — Telehealth: Payer: Self-pay

## 2021-12-07 DIAGNOSIS — H25812 Combined forms of age-related cataract, left eye: Secondary | ICD-10-CM | POA: Diagnosis not present

## 2021-12-07 NOTE — Telephone Encounter (Signed)
-----   Message from Ladell Pier, MD sent at 12/07/2021  8:12 AM EDT ----- Please call patient, the serum monoclonal protein level is stable, follow-up as scheduled

## 2021-12-07 NOTE — Telephone Encounter (Signed)
Pt verbalized understanding.

## 2022-01-08 DIAGNOSIS — U071 COVID-19: Secondary | ICD-10-CM | POA: Diagnosis not present

## 2022-01-18 DIAGNOSIS — Z1231 Encounter for screening mammogram for malignant neoplasm of breast: Secondary | ICD-10-CM | POA: Diagnosis not present

## 2022-01-20 ENCOUNTER — Encounter: Payer: Self-pay | Admitting: Internal Medicine

## 2022-01-21 ENCOUNTER — Encounter: Payer: Self-pay | Admitting: Obstetrics & Gynecology

## 2022-01-21 NOTE — Telephone Encounter (Signed)
Called and discussed vaccines and recommendations.

## 2022-02-07 DIAGNOSIS — M1611 Unilateral primary osteoarthritis, right hip: Secondary | ICD-10-CM | POA: Diagnosis not present

## 2022-02-07 DIAGNOSIS — Z96642 Presence of left artificial hip joint: Secondary | ICD-10-CM | POA: Diagnosis not present

## 2022-02-11 DIAGNOSIS — M6281 Muscle weakness (generalized): Secondary | ICD-10-CM | POA: Diagnosis not present

## 2022-02-11 DIAGNOSIS — M25651 Stiffness of right hip, not elsewhere classified: Secondary | ICD-10-CM | POA: Diagnosis not present

## 2022-02-11 DIAGNOSIS — M1611 Unilateral primary osteoarthritis, right hip: Secondary | ICD-10-CM | POA: Diagnosis not present

## 2022-02-12 DIAGNOSIS — M25551 Pain in right hip: Secondary | ICD-10-CM | POA: Diagnosis not present

## 2022-02-12 DIAGNOSIS — M1611 Unilateral primary osteoarthritis, right hip: Secondary | ICD-10-CM | POA: Diagnosis not present

## 2022-02-14 ENCOUNTER — Other Ambulatory Visit: Payer: Self-pay

## 2022-02-14 ENCOUNTER — Telehealth: Payer: Self-pay | Admitting: Internal Medicine

## 2022-02-14 DIAGNOSIS — D72819 Decreased white blood cell count, unspecified: Secondary | ICD-10-CM

## 2022-02-14 NOTE — Telephone Encounter (Signed)
Patient has a lab appt 02/19/2022, there are no orders in. 

## 2022-02-14 NOTE — Telephone Encounter (Signed)
Orders placed.

## 2022-02-19 ENCOUNTER — Other Ambulatory Visit (INDEPENDENT_AMBULATORY_CARE_PROVIDER_SITE_OTHER): Payer: Medicare HMO

## 2022-02-19 DIAGNOSIS — D72819 Decreased white blood cell count, unspecified: Secondary | ICD-10-CM

## 2022-02-19 LAB — LIPID PANEL
Cholesterol: 176 mg/dL (ref 0–200)
HDL: 73.4 mg/dL (ref >39.00)
LDL Cholesterol: 84 mg/dL (ref 0–99)
NonHDL: 102.39
Total CHOL/HDL Ratio: 2
Triglycerides: 94 mg/dL (ref 0.0–149.0)
VLDL: 18.8 mg/dL (ref 0.0–40.0)

## 2022-02-19 LAB — HEPATIC FUNCTION PANEL
ALT: 19 U/L (ref 0–35)
AST: 20 U/L (ref 0–37)
Albumin: 4 g/dL (ref 3.5–5.2)
Alkaline Phosphatase: 75 U/L (ref 39–117)
Bilirubin, Direct: 0.1 mg/dL (ref 0.0–0.3)
Total Bilirubin: 0.5 mg/dL (ref 0.2–1.2)
Total Protein: 6.3 g/dL (ref 6.0–8.3)

## 2022-02-19 LAB — BASIC METABOLIC PANEL
BUN: 19 mg/dL (ref 6–23)
CO2: 30 mEq/L (ref 19–32)
Calcium: 9.2 mg/dL (ref 8.4–10.5)
Chloride: 103 mEq/L (ref 96–112)
Creatinine, Ser: 0.61 mg/dL (ref 0.40–1.20)
GFR: 91.28 mL/min (ref >60.00)
Glucose, Bld: 69 mg/dL — ABNORMAL LOW (ref 70–99)
Potassium: 3.9 mEq/L (ref 3.5–5.1)
Sodium: 140 mEq/L (ref 135–145)

## 2022-02-20 DIAGNOSIS — M810 Age-related osteoporosis without current pathological fracture: Secondary | ICD-10-CM | POA: Diagnosis not present

## 2022-02-21 ENCOUNTER — Encounter: Payer: Self-pay | Admitting: Internal Medicine

## 2022-02-21 ENCOUNTER — Ambulatory Visit (INDEPENDENT_AMBULATORY_CARE_PROVIDER_SITE_OTHER): Payer: Medicare HMO | Admitting: Internal Medicine

## 2022-02-21 VITALS — BP 118/78 | HR 72 | Temp 97.6°F | Resp 17 | Ht 65.0 in | Wt 114.6 lb

## 2022-02-21 DIAGNOSIS — R197 Diarrhea, unspecified: Secondary | ICD-10-CM

## 2022-02-21 DIAGNOSIS — M25551 Pain in right hip: Secondary | ICD-10-CM | POA: Diagnosis not present

## 2022-02-21 DIAGNOSIS — D72819 Decreased white blood cell count, unspecified: Secondary | ICD-10-CM | POA: Diagnosis not present

## 2022-02-21 DIAGNOSIS — Z0000 Encounter for general adult medical examination without abnormal findings: Secondary | ICD-10-CM

## 2022-02-21 DIAGNOSIS — M810 Age-related osteoporosis without current pathological fracture: Secondary | ICD-10-CM

## 2022-02-21 DIAGNOSIS — Z853 Personal history of malignant neoplasm of breast: Secondary | ICD-10-CM | POA: Diagnosis not present

## 2022-02-21 DIAGNOSIS — D472 Monoclonal gammopathy: Secondary | ICD-10-CM | POA: Diagnosis not present

## 2022-02-21 NOTE — Assessment & Plan Note (Addendum)
Breast, pelvic and pap smear through gyn. Mammogram per report - 01/18/22 - Birads II.Marland Kitchen  Colonoscopy was attempted, but due to tortuous colon - procedure was incomplete. Random colonic mucosal biopsies were obtained which were normal. CT colonography did not show any colonic polyps or masses. PAP 04/2021.  The 10-year ASCVD risk score (Arnett DK, et al., 2019) is: 6.5%   Values used to calculate the score:     Age: 71 years     Sex: Female     Is Non-Hispanic African American: No     Diabetic: No     Tobacco smoker: No     Systolic Blood Pressure: 718 mmHg     Is BP treated: No     HDL Cholesterol: 73.4 mg/dL     Total Cholesterol: 176 mg/dL  Continue diet and exercise.

## 2022-02-21 NOTE — Progress Notes (Signed)
Patient ID: Tanya Fox, female   DOB: 1951-03-12, 71 y.o.   MRN: [id number]   Subjective:    Patient ID: Tanya Fox, female    DOB: 04/26/1950, 71 y.o.   MRN: [id number]   Patient here for  Chief Complaint  Patient presents with   Annual Exam    CPE   .   HPI Here for her physical. Saw Dr Honor Junes 02/20/22 - f/u reclast. First infusion 03/2021.  BMD 02/2021  no significant change. 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 Labs 12/04/21 - stable.  Recommended f/u 6 months. Saw GI 10/18/21 - chronic diarrhea.  Colonoscopy was attempted, but due to tortuous colon and splenic flexure fixture procedure was incomplete. Random colonic mucosal biopsies were obtained which were normal. CT colonography did not show any colonic polyps or masses. She attempted trial of Colestid, Questran, and Levbid without improvement in her diarrhea. 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 Trial of lomotil.  Taking imodium. Saw GYN 04/2021 - PAP. (04/27/2021).  Has been having problems with her hip.  S/p injection.  Helped.  Tries to stay active.  No chest pain or sob reported.  No abdominal pain reported.     Past Medical History:  Diagnosis Date   Atrophic vaginitis    BRCA negative    Breast cancer ([place]) 2003   Left side, Stage I   Collar bone fracture    Osteoporosis 12/2018   T score -3.1   Past Surgical History:  Procedure Laterality Date   BREAST SURGERY  2003-2004   Lumpectomy-Lymph nodes-Port a cath-Radiation and Chemo   CESAREAN SECTION     X 2   HERNIA REPAIR     TOTAL HIP ARTHROPLASTY     Left   TUBAL LIGATION     Family History  Problem Relation Age of Onset   Hypertension Mother    Breast cancer Mother        Age 57   Heart disease Father    Alcohol abuse Father    Breast cancer Sister        Age 18   Cancer Maternal Grandfather        Colon cancer   Heart disease Maternal Grandfather    Diabetes Paternal Grandmother    Social History   Socioeconomic History   Marital status:  Married    Spouse name: Not on file   Number of children: 2   Years of education: Not on file   Highest education level: Not on file  Occupational History    Employer: [employer]  Tobacco Use   Smoking status: Never   Smokeless tobacco: Never  Vaping Use   Vaping Use: Never used  Substance and Sexual Activity   Alcohol use: Yes    Alcohol/week: 0.0 standard drinks of alcohol    Comment: very rare   Drug use: No   Sexual activity: Yes    Birth control/protection: Surgical    Comment: BTL-1st intercourse 58 yo-1 partner  Other Topics Concern   Not on file  Social History Narrative   Not on file   Social Determinants of Health   Financial Resource Strain: Low Risk  (05/31/2021)   Overall Financial Resource Strain (CARDIA)    Difficulty of Paying Living Expenses: Not hard at all  Food Insecurity: No Food Insecurity (05/31/2021)   Hunger Vital Sign    Worried About Running Out of Food in the Last Year: Never true    Ran Out of Food in the  Last Year: Never true  Transportation Needs: No Transportation Needs (05/31/2021)   PRAPARE - Hydrologist (Medical): No    Lack of Transportation (Non-Medical): No  Physical Activity: Sufficiently Active (05/31/2021)   Exercise Vital Sign    Days of Exercise per Week: 5 days    Minutes of Exercise per Session: 30 min  Stress: No Stress Concern Present (05/31/2021)   [place]    Feeling of Stress : Not at all  Social Connections: Unknown (05/31/2021)   Social Connection and Isolation Panel [NHANES]    Frequency of Communication with Friends and Family: More than three times a week    Frequency of Social Gatherings with Friends and Family: More than three times a week    Attends Religious Services: Not on Advertising copywriter or Organizations: Not on file    Attends Archivist Meetings: Not on file    Marital Status:  Not on file     Review of Systems  Constitutional:  Negative for appetite change and unexpected weight change.  HENT:  Negative for congestion, sinus pressure and sore throat.   Eyes:  Negative for pain and visual disturbance.  Respiratory:  Negative for cough, chest tightness and shortness of breath.   Cardiovascular:  Negative for chest pain, palpitations and leg swelling.  Gastrointestinal:  Negative for abdominal pain, diarrhea, nausea and vomiting.  Genitourinary:  Negative for difficulty urinating and dysuria.  Musculoskeletal:  Negative for joint swelling and myalgias.       Has been having issues with her hip as outlined.   Skin:  Negative for color change and rash.  Neurological:  Negative for dizziness, light-headedness and headaches.  Hematological:  Negative for adenopathy. Does not bruise/bleed easily.  Psychiatric/Behavioral:  Negative for agitation and dysphoric mood.        Objective:     BP 118/78 (BP Location: Left Arm, Patient Position: Sitting, Cuff Size: Small)   Pulse 72   Temp 97.6 F (36.4 C) (Temporal)   Resp 17   Ht _0  (1.651 m)   Wt 114 lb 9.6 oz (52 kg)   SpO2 98%   BMI 19.07 kg/m  Wt Readings from Last 3 Encounters:  02/21/22 114 lb 9.6 oz (52 kg)  12/04/21 115 lb (52.2 kg)  06/06/21 113 lb 6.4 oz (51.4 kg)    Physical Exam Vitals reviewed.  Constitutional:      General: She is not in acute distress.    Appearance: Normal appearance.  HENT:     Head: Normocephalic and atraumatic.     Right Ear: External ear normal.     Left Ear: External ear normal.  Eyes:     General: No scleral icterus.       Right eye: No discharge.        Left eye: No discharge.     Conjunctiva/sclera: Conjunctivae normal.  Neck:     Thyroid: No thyromegaly.  Cardiovascular:     Rate and Rhythm: Normal rate and regular rhythm.  Pulmonary:     Effort: No respiratory distress.     Breath sounds: Normal breath sounds. No wheezing.  Abdominal:     General:  Bowel sounds are normal.     Palpations: Abdomen is soft.     Tenderness: There is no abdominal tenderness.  Genitourinary:    Comments: Sees gyn.  Musculoskeletal:  General: No swelling or tenderness.     Cervical back: Neck supple. No tenderness.  Lymphadenopathy:     Cervical: No cervical adenopathy.  Skin:    Findings: No erythema or rash.  Neurological:     Mental Status: She is alert.  Psychiatric:        Mood and Affect: Mood normal.        Behavior: Behavior normal.      Outpatient Encounter Medications as of 02/21/2022  Medication Sig   Cholecalciferol (VITAMIN D PO) Take by mouth.   hydrOXYzine (ATARAX) 10 MG tablet TAKE 1-2 TABLETS BY MOUTH AT BEDTIME AS NEEDED   Multiple Vitamin (MULTIVITAMIN) tablet Take 1 tablet by mouth daily.     zoledronic acid (RECLAST) 5 MG/100ML SOLN injection Inject 5 mg into the vein once.   No facility-administered encounter medications on file as of 02/21/2022.     Lab Results  Component Value Date   WBC 3.6 (L) 12/04/2021   HGB 12.8 12/04/2021   HCT 38.0 12/04/2021   PLT 176 12/04/2021   GLUCOSE 69 (L) 02/19/2022   CHOL 176 02/19/2022   TRIG 94.0 02/19/2022   HDL 73.40 02/19/2022   LDLCALC 84 02/19/2022   ALT 19 02/19/2022   AST 20 02/19/2022   NA 140 02/19/2022   K 3.9 02/19/2022   CL 103 02/19/2022   CREATININE 0.61 02/19/2022   BUN 19 02/19/2022   CO2 30 02/19/2022   TSH 3.24 02/15/2020   INR 1.2 (H) 03/28/2016    DG Chest 2 View  Result Date: 11/07/2021 CLINICAL DATA:  Fever and cough since July 17, low oxygen levels concern for pneumonia. Chest tightness and cough times 10 days. EXAM: CHEST - 2 VIEW COMPARISON:  No recent relevant priors available for comparison at time dictation. FINDINGS: The heart size and mediastinal contours are within normal limits. Chronic bronchitic lung changes. No focal airspace consolidation. No pleural effusion. No pneumothorax. The visualized skeletal structures are unremarkable.  IMPRESSION: No acute cardiopulmonary disease. Electronically Signed   By: Dahlia Bailiff M.D.   On: 11/07/2021 11:15       Assessment & Plan:   Problem List Items Addressed This Visit     Diarrhea    Saw GI 10/18/21 - chronic diarrhea.  Colonoscopy was attempted, but due to tortuous colon - procedure was incomplete. Random colonic mucosal biopsies were obtained which were normal. CT colonography did not show any colonic polyps or masses. She attempted trial of Colestid, Questran, and Levbid without improvement in her diarrhea. 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 Trial of lomotil.  Taking imodium.       Health care maintenance    Breast, pelvic and pap smear through gyn. Mammogram per report - 01/18/22 - Birads II.Marland Kitchen  Colonoscopy was attempted, but due to tortuous colon - procedure was incomplete. Random colonic mucosal biopsies were obtained which were normal. CT colonography did not show any colonic polyps or masses. PAP 04/2021.  The 10-year ASCVD risk score (Arnett DK, et al., 2019) is: 6.5%   Values used to calculate the score:     Age: 70 years     Sex: Female     Is Non-Hispanic African American: No     Diabetic: No     Tobacco smoker: No     Systolic Blood Pressure: 161 mmHg     Is BP treated: No     HDL Cholesterol: 73.4 mg/dL     Total Cholesterol: 176 mg/dL  Continue diet and exercise.  History of breast cancer    Mammogram 01/18/22 - Birads II.       Leukopenia    Follow cbc.       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 Labs 12/04/21 - stable.  Recommended f/u 6 months.       Relevant Orders   Basic metabolic panel   Hepatic function panel   Osteoporosis    Saw Dr Honor Junes 02/20/22 - f/u reclast. First infusion 03/2021.       Relevant Orders   Lipid panel   TSH   Right hip pain    S/p injection.  Helped.        Other Visit Diagnoses     Routine general medical examination at a health care facility     -  Primary        Einar Pheasant, MD

## 2022-03-04 ENCOUNTER — Encounter: Payer: Self-pay | Admitting: Internal Medicine

## 2022-03-04 DIAGNOSIS — M25551 Pain in right hip: Secondary | ICD-10-CM | POA: Insufficient documentation

## 2022-03-04 NOTE — Assessment & Plan Note (Signed)
Mammogram 01/18/22 - Birads II.

## 2022-03-04 NOTE — Assessment & Plan Note (Signed)
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 Labs 12/04/21 - stable.  Recommended f/u 6 months.

## 2022-03-04 NOTE — Assessment & Plan Note (Signed)
Saw Dr Honor Junes 02/20/22 - f/u reclast. First infusion 03/2021.

## 2022-03-04 NOTE — Assessment & Plan Note (Signed)
Follow cbc.  

## 2022-03-04 NOTE — Assessment & Plan Note (Signed)
Saw GI 10/18/21 - chronic diarrhea.  Colonoscopy was attempted, but due to tortuous colon - procedure was incomplete. Random colonic mucosal biopsies were obtained which were normal. CT colonography did not show any colonic polyps or masses. She attempted trial of Colestid, Questran, and Levbid without improvement in her diarrhea. 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 Trial of lomotil.  Taking imodium.

## 2022-03-04 NOTE — Assessment & Plan Note (Signed)
S/p injection.  Helped.

## 2022-03-14 DIAGNOSIS — M1611 Unilateral primary osteoarthritis, right hip: Secondary | ICD-10-CM | POA: Diagnosis not present

## 2022-03-20 DIAGNOSIS — M25551 Pain in right hip: Secondary | ICD-10-CM | POA: Diagnosis not present

## 2022-03-27 DIAGNOSIS — D1801 Hemangioma of skin and subcutaneous tissue: Secondary | ICD-10-CM | POA: Diagnosis not present

## 2022-03-27 DIAGNOSIS — L814 Other melanin hyperpigmentation: Secondary | ICD-10-CM | POA: Diagnosis not present

## 2022-03-27 DIAGNOSIS — X32XXXA Exposure to sunlight, initial encounter: Secondary | ICD-10-CM | POA: Diagnosis not present

## 2022-03-27 DIAGNOSIS — L821 Other seborrheic keratosis: Secondary | ICD-10-CM | POA: Diagnosis not present

## 2022-03-27 DIAGNOSIS — L815 Leukoderma, not elsewhere classified: Secondary | ICD-10-CM | POA: Diagnosis not present

## 2022-03-28 DIAGNOSIS — M25551 Pain in right hip: Secondary | ICD-10-CM | POA: Diagnosis not present

## 2022-03-29 ENCOUNTER — Telehealth: Payer: Self-pay

## 2022-03-29 NOTE — Telephone Encounter (Signed)
I received [facility] Clearance request for patient via fax.  I handed fax to Denita Lung, [place].

## 2022-04-01 ENCOUNTER — Telehealth: Payer: Self-pay | Admitting: Internal Medicine

## 2022-04-01 DIAGNOSIS — M810 Age-related osteoporosis without current pathological fracture: Secondary | ICD-10-CM | POA: Diagnosis not present

## 2022-04-01 NOTE — Telephone Encounter (Signed)
Preoperative Risk Assessment Received from [facility] on 04/01/2022. Form handed to [facility], who was Dr Bary Leriche CMA for the day.

## 2022-04-02 NOTE — Telephone Encounter (Signed)
Lm for pt to cb re : Flu ?

## 2022-04-02 NOTE — Telephone Encounter (Signed)
Per pt does not have flu - no sx no issues.  Needs appointment for pre-op

## 2022-04-03 ENCOUNTER — Other Ambulatory Visit: Payer: Self-pay | Admitting: Internal Medicine

## 2022-04-03 NOTE — Telephone Encounter (Signed)
Pt scheduled for Friday 12/20 at 12:30 for pre-op

## 2022-04-05 ENCOUNTER — Ambulatory Visit (INDEPENDENT_AMBULATORY_CARE_PROVIDER_SITE_OTHER): Payer: Medicare HMO | Admitting: Internal Medicine

## 2022-04-05 ENCOUNTER — Encounter: Payer: Self-pay | Admitting: Internal Medicine

## 2022-04-05 VITALS — BP 136/80 | HR 75 | Temp 98.0°F | Resp 15 | Ht 65.0 in | Wt 112.2 lb

## 2022-04-05 DIAGNOSIS — M810 Age-related osteoporosis without current pathological fracture: Secondary | ICD-10-CM | POA: Diagnosis not present

## 2022-04-05 DIAGNOSIS — Z01818 Encounter for other preprocedural examination: Secondary | ICD-10-CM | POA: Diagnosis not present

## 2022-04-05 DIAGNOSIS — Z008 Encounter for other general examination: Secondary | ICD-10-CM | POA: Diagnosis not present

## 2022-04-05 DIAGNOSIS — D472 Monoclonal gammopathy: Secondary | ICD-10-CM | POA: Diagnosis not present

## 2022-04-05 LAB — BASIC METABOLIC PANEL
BUN: 18 mg/dL (ref 6–23)
CO2: 28 mEq/L (ref 19–32)
Calcium: 8.9 mg/dL (ref 8.4–10.5)
Chloride: 104 mEq/L (ref 96–112)
Creatinine, Ser: 0.62 mg/dL (ref 0.40–1.20)
GFR: 90.85 mL/min (ref >60.00)
Glucose, Bld: 81 mg/dL (ref 70–99)
Potassium: 3.9 mEq/L (ref 3.5–5.1)
Sodium: 140 mEq/L (ref 135–145)

## 2022-04-05 LAB — CBC WITH DIFFERENTIAL/PLATELET
Basophils Absolute: 0 10*3/uL (ref 0.0–0.1)
Basophils Relative: 1 % (ref 0.0–3.0)
Eosinophils Absolute: 0.2 10*3/uL (ref 0.0–0.7)
Eosinophils Relative: 4.7 % (ref 0.0–5.0)
HCT: 37.4 % (ref 36.0–46.0)
Hemoglobin: 12.7 g/dL (ref 12.0–15.0)
Lymphocytes Relative: 32 % (ref 12.0–46.0)
Lymphs Abs: 1.4 10*3/uL (ref 0.7–4.0)
MCHC: 34 g/dL (ref 30.0–36.0)
MCV: 94 fl (ref 78.0–100.0)
Monocytes Absolute: 0.4 10*3/uL (ref 0.1–1.0)
Monocytes Relative: 9 % (ref 3.0–12.0)
Neutro Abs: 2.4 10*3/uL (ref 1.4–7.7)
Neutrophils Relative %: 53.3 % (ref 43.0–77.0)
Platelets: 231 10*3/uL (ref 150.0–400.0)
RBC: 3.98 Mil/uL (ref 3.87–5.11)
RDW: 13.5 % (ref 11.5–15.5)
WBC: 4.5 10*3/uL (ref 4.0–10.5)

## 2022-04-08 ENCOUNTER — Encounter: Payer: Self-pay | Admitting: Internal Medicine

## 2022-04-08 NOTE — Assessment & Plan Note (Signed)
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 Labs 12/04/21 - stable.  Recommended f/u 6 months.

## 2022-04-08 NOTE — Assessment & Plan Note (Addendum)
Planning for orthopedic surgery.  Here for pre op evaluation.  No chest pain or sob reported.  No cough or congestion.  EKG - SR with no acute ischemic changes.  Given no chest pain or sob and given EKG - no acute findings - feel she is at low risk from a cardiac standpoint to proceed with planned surgery.  Will need close intra op and post op monitoring of heart rate and blood pressure to avoid extremes.  Information faxed to [place].

## 2022-04-08 NOTE — Assessment & Plan Note (Signed)
Saw Dr Honor Junes 02/20/22 - f/u reclast. First infusion 03/2021.

## 2022-04-08 NOTE — Progress Notes (Signed)
Patient ID: Tanya Fox, female   DOB: 02/24/1951, 71 y.o.   MRN: [id number]   Subjective:    Patient ID: Tanya Fox, female    DOB: May 06, 1950, 71 y.o.   MRN: [id number]  Patient here for  Chief Complaint  Patient presents with   Pre-op Exam    HPI Planning for orthopedic surgery.  Increased pain.  Can't stand for long periods of time.  Taking tramadol prn.  Limiting her activity.  No chest pain or sob reported.  No cough or congestion.  No acid reflux.  No abdominal pain.  Bowels moving.  Has previously been very active.     Past Medical History:  Diagnosis Date   Atrophic vaginitis    BRCA negative    Breast cancer ([place]) 2003   Left side, Stage I   Collar bone fracture    Osteoporosis 12/2018   T score -3.1   Past Surgical History:  Procedure Laterality Date   BREAST SURGERY  2003-2004   Lumpectomy-Lymph nodes-Port a cath-Radiation and Chemo   CESAREAN SECTION     X 2   HERNIA REPAIR     TOTAL HIP ARTHROPLASTY     Left   TUBAL LIGATION     Family History  Problem Relation Age of Onset   Hypertension Mother    Breast cancer Mother        Age 11   Heart disease Father    Alcohol abuse Father    Breast cancer Sister        Age 16   Cancer Maternal Grandfather        Colon cancer   Heart disease Maternal Grandfather    Diabetes Paternal Grandmother    Social History   Socioeconomic History   Marital status: Married    Spouse name: Not on file   Number of children: 2   Years of education: Not on file   Highest education level: Not on file  Occupational History    Employer: [employer]  Tobacco Use   Smoking status: Never   Smokeless tobacco: Never  Vaping Use   Vaping Use: Never used  Substance and Sexual Activity   Alcohol use: Yes    Alcohol/week: 0.0 standard drinks of alcohol    Comment: very rare   Drug use: No   Sexual activity: Yes    Birth control/protection: Surgical    Comment: BTL-1st intercourse 55 yo-1 partner  Other  Topics Concern   Not on file  Social History Narrative   Not on file   Social Determinants of Health   Financial Resource Strain: Low Risk  (05/31/2021)   Overall Financial Resource Strain (CARDIA)    Difficulty of Paying Living Expenses: Not hard at all  Food Insecurity: No Food Insecurity (05/31/2021)   Hunger Vital Sign    Worried About Running Out of Food in the Last Year: Never true    Ran Out of Food in the Last Year: Never true  Transportation Needs: No Transportation Needs (05/31/2021)   PRAPARE - Hydrologist (Medical): No    Lack of Transportation (Non-Medical): No  Physical Activity: Sufficiently Active (05/31/2021)   Exercise Vital Sign    Days of Exercise per Week: 5 days    Minutes of Exercise per Session: 30 min  Stress: No Stress Concern Present (05/31/2021)   [place]    Feeling of Stress : Not at all  Social  Connections: Unknown (05/31/2021)   Social Connection and Isolation Panel [NHANES]    Frequency of Communication with Friends and Family: More than three times a week    Frequency of Social Gatherings with Friends and Family: More than three times a week    Attends Religious Services: Not on Advertising copywriter or Organizations: Not on file    Attends Archivist Meetings: Not on file    Marital Status: Not on file     Review of Systems  Constitutional:  Negative for appetite change and unexpected weight change.  HENT:  Negative for congestion and sinus pressure.   Respiratory:  Negative for cough, chest tightness and shortness of breath.   Cardiovascular:  Negative for chest pain, palpitations and leg swelling.  Gastrointestinal:  Negative for abdominal pain, diarrhea, nausea and vomiting.  Genitourinary:  Negative for difficulty urinating and dysuria.  Musculoskeletal:  Negative for joint swelling and myalgias.  Skin:  Negative for color  change and rash.  Neurological:  Negative for dizziness, light-headedness and headaches.  Psychiatric/Behavioral:  Negative for agitation and dysphoric mood.        Objective:     BP 136/80 (BP Location: Left Arm, Patient Position: Sitting, Cuff Size: Small)   Pulse 75   Temp 98 F (36.7 C) (Temporal)   Resp 15   Ht _0  (1.651 m)   Wt 112 lb 3.2 oz (50.9 kg)   SpO2 96%   BMI 18.67 kg/m  Wt Readings from Last 3 Encounters:  04/05/22 112 lb 3.2 oz (50.9 kg)  02/21/22 114 lb 9.6 oz (52 kg)  12/04/21 115 lb (52.2 kg)    Physical Exam Vitals reviewed.  Constitutional:      General: She is not in acute distress.    Appearance: Normal appearance.  HENT:     Head: Normocephalic and atraumatic.     Right Ear: External ear normal.     Left Ear: External ear normal.  Eyes:     General: No scleral icterus.       Right eye: No discharge.        Left eye: No discharge.     Conjunctiva/sclera: Conjunctivae normal.  Neck:     Thyroid: No thyromegaly.  Cardiovascular:     Rate and Rhythm: Normal rate and regular rhythm.  Pulmonary:     Effort: No respiratory distress.     Breath sounds: Normal breath sounds. No wheezing.  Abdominal:     General: Bowel sounds are normal.     Palpations: Abdomen is soft.     Tenderness: There is no abdominal tenderness.  Musculoskeletal:        General: No swelling or tenderness.     Cervical back: Neck supple. No tenderness.  Lymphadenopathy:     Cervical: No cervical adenopathy.  Skin:    Findings: No erythema or rash.  Neurological:     Mental Status: She is alert.  Psychiatric:        Mood and Affect: Mood normal.        Behavior: Behavior normal.      Outpatient Encounter Medications as of 04/05/2022  Medication Sig   Cholecalciferol (VITAMIN D PO) Take by mouth.   hydrOXYzine (ATARAX) 10 MG tablet TAKE 1-2 TABLETS BY MOUTH AT BEDTIME AS NEEDED   Multiple Vitamin (MULTIVITAMIN) tablet Take 1 tablet by mouth daily.      zoledronic acid (RECLAST) 5 MG/100ML SOLN injection Inject 5 mg into the  vein once.   No facility-administered encounter medications on file as of 04/05/2022.     Lab Results  Component Value Date   WBC 4.5 04/05/2022   HGB 12.7 04/05/2022   HCT 37.4 04/05/2022   PLT 231.0 04/05/2022   GLUCOSE 81 04/05/2022   CHOL 176 02/19/2022   TRIG 94.0 02/19/2022   HDL 73.40 02/19/2022   LDLCALC 84 02/19/2022   ALT 19 02/19/2022   AST 20 02/19/2022   NA 140 04/05/2022   K 3.9 04/05/2022   CL 104 04/05/2022   CREATININE 0.62 04/05/2022   BUN 18 04/05/2022   CO2 28 04/05/2022   TSH 3.24 02/15/2020   INR 1.2 (H) 03/28/2016    DG Chest 2 View  Result Date: 11/07/2021 CLINICAL DATA:  Fever and cough since July 17, low oxygen levels concern for pneumonia. Chest tightness and cough times 10 days. EXAM: CHEST - 2 VIEW COMPARISON:  No recent relevant priors available for comparison at time dictation. FINDINGS: The heart size and mediastinal contours are within normal limits. Chronic bronchitic lung changes. No focal airspace consolidation. No pleural effusion. No pneumothorax. The visualized skeletal structures are unremarkable. IMPRESSION: No acute cardiopulmonary disease. Electronically Signed   By: Dahlia Bailiff M.D.   On: 11/07/2021 11:15       Assessment & Plan:  Pre-surgical psychological assessment, encounter for -     CBC with Differential/Platelet -     Basic metabolic panel -     EKG [id number]  Pre-op evaluation Assessment & Plan: Planning for orthopedic surgery.  Here for pre op evaluation.  No chest pain or sob reported.  No cough or congestion.  EKG - SR with no acute ischemic changes.  Given no chest pain or sob and given EKG - no acute findings - feel she is at low risk from a cardiac standpoint to proceed with planned surgery.  Will need close intra op and post op monitoring of heart rate and blood pressure to avoid extremes.  Information faxed to [facility].    Osteoporosis  without current pathological fracture, unspecified osteoporosis type Assessment & Plan: Saw Dr Honor Junes 02/20/22 - f/u reclast. First infusion 03/2021.    MGUS (monoclonal gammopathy of unknown significance) Assessment & Plan: Seeing Dr Benay Spice for f/u breast cancer and presumed MGUS. Labs 12/04/21 - stable.  Recommended f/u 6 months.       Einar Pheasant, MD

## 2022-04-12 NOTE — Telephone Encounter (Signed)
FAXED

## 2022-04-12 NOTE — Telephone Encounter (Signed)
Judie from [facility] called stating she need last office notes for pt by Tuesday 1/2 Fax-[phone] attention judie

## 2022-04-16 DIAGNOSIS — M1611 Unilateral primary osteoarthritis, right hip: Secondary | ICD-10-CM | POA: Diagnosis not present

## 2022-04-16 DIAGNOSIS — M25551 Pain in right hip: Secondary | ICD-10-CM | POA: Diagnosis not present

## 2022-04-22 DIAGNOSIS — R262 Difficulty in walking, not elsewhere classified: Secondary | ICD-10-CM | POA: Diagnosis not present

## 2022-04-22 DIAGNOSIS — M25651 Stiffness of right hip, not elsewhere classified: Secondary | ICD-10-CM | POA: Diagnosis not present

## 2022-04-22 DIAGNOSIS — M1611 Unilateral primary osteoarthritis, right hip: Secondary | ICD-10-CM | POA: Diagnosis not present

## 2022-04-24 DIAGNOSIS — M1611 Unilateral primary osteoarthritis, right hip: Secondary | ICD-10-CM | POA: Diagnosis not present

## 2022-04-24 DIAGNOSIS — Z96641 Presence of right artificial hip joint: Secondary | ICD-10-CM | POA: Diagnosis not present

## 2022-04-26 DIAGNOSIS — M25651 Stiffness of right hip, not elsewhere classified: Secondary | ICD-10-CM | POA: Diagnosis not present

## 2022-04-26 DIAGNOSIS — Z96641 Presence of right artificial hip joint: Secondary | ICD-10-CM | POA: Diagnosis not present

## 2022-04-26 DIAGNOSIS — M6281 Muscle weakness (generalized): Secondary | ICD-10-CM | POA: Diagnosis not present

## 2022-04-29 DIAGNOSIS — M6281 Muscle weakness (generalized): Secondary | ICD-10-CM | POA: Diagnosis not present

## 2022-04-29 DIAGNOSIS — M25651 Stiffness of right hip, not elsewhere classified: Secondary | ICD-10-CM | POA: Diagnosis not present

## 2022-04-29 DIAGNOSIS — Z96641 Presence of right artificial hip joint: Secondary | ICD-10-CM | POA: Diagnosis not present

## 2022-05-06 DIAGNOSIS — M6281 Muscle weakness (generalized): Secondary | ICD-10-CM | POA: Diagnosis not present

## 2022-05-06 DIAGNOSIS — Z96641 Presence of right artificial hip joint: Secondary | ICD-10-CM | POA: Diagnosis not present

## 2022-05-06 DIAGNOSIS — M25651 Stiffness of right hip, not elsewhere classified: Secondary | ICD-10-CM | POA: Diagnosis not present

## 2022-05-07 DIAGNOSIS — Z471 Aftercare following joint replacement surgery: Secondary | ICD-10-CM | POA: Diagnosis not present

## 2022-05-07 DIAGNOSIS — M25651 Stiffness of right hip, not elsewhere classified: Secondary | ICD-10-CM | POA: Diagnosis not present

## 2022-05-14 DIAGNOSIS — M25651 Stiffness of right hip, not elsewhere classified: Secondary | ICD-10-CM | POA: Diagnosis not present

## 2022-05-14 DIAGNOSIS — M6281 Muscle weakness (generalized): Secondary | ICD-10-CM | POA: Diagnosis not present

## 2022-05-14 DIAGNOSIS — Z96641 Presence of right artificial hip joint: Secondary | ICD-10-CM | POA: Diagnosis not present

## 2022-05-22 DIAGNOSIS — M25651 Stiffness of right hip, not elsewhere classified: Secondary | ICD-10-CM | POA: Diagnosis not present

## 2022-05-22 DIAGNOSIS — Z96641 Presence of right artificial hip joint: Secondary | ICD-10-CM | POA: Diagnosis not present

## 2022-05-22 DIAGNOSIS — M6281 Muscle weakness (generalized): Secondary | ICD-10-CM | POA: Diagnosis not present

## 2022-05-28 DIAGNOSIS — M6281 Muscle weakness (generalized): Secondary | ICD-10-CM | POA: Diagnosis not present

## 2022-05-28 DIAGNOSIS — Z96641 Presence of right artificial hip joint: Secondary | ICD-10-CM | POA: Diagnosis not present

## 2022-05-28 DIAGNOSIS — M25651 Stiffness of right hip, not elsewhere classified: Secondary | ICD-10-CM | POA: Diagnosis not present

## 2022-06-03 ENCOUNTER — Ambulatory Visit (INDEPENDENT_AMBULATORY_CARE_PROVIDER_SITE_OTHER): Payer: Medicare HMO

## 2022-06-03 VITALS — Ht 65.0 in | Wt 112.0 lb

## 2022-06-03 DIAGNOSIS — Z0000 Encounter for general adult medical examination without abnormal findings: Secondary | ICD-10-CM

## 2022-06-03 NOTE — Progress Notes (Signed)
Subjective:   Tanya Fox is a 72 y.o. female who presents for Medicare Annual (Subsequent) preventive examination.  Review of Systems    No ROS.  Medicare Wellness Virtual Visit.  Visual/audio telehealth visit, UTA vital signs.   See social history for additional risk factors.   Cardiac Risk Factors include: advanced age (>72mn, >>81women)     Objective:    Today's Vitals   06/03/22 1113  Weight: 112 lb (50.8 kg)  Height: 5' 5"$  (1.651 m)   Body mass index is 18.64 kg/m.     06/06/2022   10:48 AM 06/03/2022   11:26 AM 05/31/2021   12:38 PM 11/15/2020    1:15 AM 05/30/2020   12:38 PM  Advanced Directives  Does Patient Have a Medical Advance Directive? No Yes Yes No Yes  Type of ACorporate treasurerof AGiff[place]Living will H[place]Living will  [place] Park[place] will  Does patient want to make changes to medical advance directive? No - Patient declined No - Patient declined No - Patient declined  No - Patient declined  Copy of [place] Chart?  No - copy requested No - copy requested  No - copy requested  Would patient like information on creating a medical advance directive? No - Patient declined        Current Medications (verified) Outpatient Encounter Medications as of 06/03/2022  Medication Sig   Cholecalciferol (VITAMIN D PO) Take by mouth.   hydrOXYzine (ATARAX) 10 MG tablet TAKE 1-2 TABLETS BY MOUTH AT BEDTIME AS NEEDED   Multiple Vitamin (MULTIVITAMIN) tablet Take 1 tablet by mouth daily.     zoledronic acid (RECLAST) 5 MG/100ML SOLN injection Inject 5 mg into the vein once.   No facility-administered encounter medications on file as of 06/03/2022.    Allergies (verified) Patient has no known allergies.   History: Past Medical History:  Diagnosis Date   Atrophic vaginitis    BRCA negative    [place]   [place], Stage I   Collar bone fracture    Osteoporosis  12/2018   T score -3.1   Past Surgical History:  Procedure Laterality Date   BREAST SURGERY  2003-2004   Lumpectomy-Lymph nodes-Port a cath-Radiation and Chemo   CESAREAN SECTION     X 2   HERNIA REPAIR     TOTAL HIP ARTHROPLASTY     Left   TUBAL LIGATION     Family History  Problem Relation Age of Onset   Hypertension Mother    Breast cancer Mother        Age 70  Heart disease Father    Alcohol abuse Father    Breast cancer Sister        Age 70  Cancer Maternal Grandfather        Colon cancer   Heart disease Maternal Grandfather    Diabetes Paternal Grandmother    Social History   Socioeconomic History   Marital status: Married    Spouse name: Not on file   Number of children: 2   Years of education: Not on file   Highest education level: Not on file  Occupational History    Employer: [employer]  Tobacco Use   Smoking status: Never   Smokeless tobacco: Never  Vaping Use   Vaping Use: Never used  Substance and Sexual Activity   Alcohol use: Yes    Alcohol/week: 0.0 standard drinks of  alcohol    Comment: very rare   Drug use: No   Sexual activity: Yes    Birth control/protection: Surgical    Comment: BTL-1st intercourse 60 yo-1 partner  Other Topics Concern   Not on file  Social History Narrative   Not on file   Social Determinants of Health   Financial Resource Strain: Low Risk  (06/03/2022)   Overall Financial Resource Strain (CARDIA)    Difficulty of Paying Living Expenses: Not hard at all  Food Insecurity: No Food Insecurity (06/03/2022)   Hunger Vital Sign    Worried About Running Out of Food in the Last Year: Never true    Ran Out of Food in the Last Year: Never true  Transportation Needs: No Transportation Needs (06/03/2022)   PRAPARE - Hydrologist (Medical): No    Lack of Transportation (Non-Medical): No  Physical Activity: Sufficiently Active (06/03/2022)   Exercise Vital Sign    Days of Exercise per  Week: 5 days    Minutes of Exercise per Session: 30 min  Stress: No Stress Concern Present (06/03/2022)   [place]    Feeling of Stress : Not at all  Social Connections: Unknown (06/03/2022)   Social Connection and Isolation Panel [NHANES]    Frequency of Communication with Friends and Family: More than three times a week    Frequency of Social Gatherings with Friends and Family: More than three times a week    Attends Religious Services: Not on Advertising copywriter or Organizations: Not on file    Attends Archivist Meetings: Not on file    Marital Status: Not on file    Tobacco Counseling Counseling given: Not Answered   Clinical Intake:  Pre-visit preparation completed: Yes        Diabetes: No  How often do you need to have someone help you when you read instructions, pamphlets, or other written materials from your doctor or pharmacy?: 1 - Never    Interpreter Needed?: No    Activities of Daily Living    06/03/2022   11:12 AM  In your present state of health, do you have any difficulty performing the following activities:  Hearing? 0  Vision? 0  Difficulty concentrating or making decisions? 0  Walking or climbing stairs? 0  Dressing or bathing? 0  Doing errands, shopping? 0  Preparing Food and eating ? N  Using the Toilet? N  In the past six months, have you accidently leaked urine? N  Do you have problems with loss of bowel control? N  Managing your Medications? N  Managing your Finances? N  Housekeeping or managing your Housekeeping? N    Patient Care Team: Einar Pheasant, MD as PCP - General (Unknown Physician Specialty)  Indicate any recent Medical Services you may have received from other than [facility] providers in the past year (date may be approximate).     Assessment:   This is a routine wellness examination for [place].  I connected with  [name] on  06/06/22 by a audio enabled telemedicine application and verified that I am speaking with the correct person using two identifiers.  Patient Location: Home  Provider Location: Office/Clinic  I discussed the limitations of evaluation and management by telemedicine. The patient expressed understanding and agreed to proceed.   Hearing/Vision screen Hearing Screening - Comments:: Patient is able to hear conversational tones without difficulty.  No issues reported.   Vision Screening - Comments:: They have seen their ophthalmologist in the last 12 months.  Dietary issues and exercise activities discussed: Current Exercise Habits: Home exercise routine, Type of exercise: walking, Time (Minutes): 30, Frequency (Times/Week): 5, Weekly Exercise (Minutes/Week): 150, Intensity: Mild   Goals Addressed               This Visit's Progress     Patient Stated     Maintain healthy lifestyle (pt-stated)        Stay active Healthy diet       Other     Follow up with Primary Care Provider        As needed       Depression Screen    06/03/2022   11:18 AM 06/03/2022   11:12 AM 04/05/2022   12:42 PM 05/31/2021   12:43 PM 05/30/2020   12:36 PM 10/14/2017    9:45 AM 02/27/2016    9:03 AM  PHQ 2/9 Scores  PHQ - 2 Score 0 0 0 0 0 0 0    Fall Risk    06/03/2022   11:12 AM 04/05/2022   12:42 PM 05/31/2021   12:39 PM 05/30/2020   12:39 PM 02/18/2020   11:37 AM  [place] in the past year? 0 0 0 0 0  Number falls in past yr: 0 0 0 0   Injury with Fall? 0 0  0   Risk for fall due to :  No Fall Risks     Follow up Falls evaluation completed;Falls prevention discussed Falls evaluation completed Falls evaluation completed Falls evaluation completed Falls evaluation completed    FALL RISK PREVENTION PERTAINING TO THE HOME: Home free of loose throw rugs in walkways, pet beds, electrical cords, etc? Yes  Adequate lighting in your home to reduce risk of falls? Yes   ASSISTIVE DEVICES  UTILIZED TO PREVENT FALLS: Life alert? No  Use of a cane, walker or w/c? No   TIMED UP AND GO: Was the test performed? No .   Cognitive Function:        06/03/2022   11:34 AM 05/31/2021   12:47 PM  6CIT Screen  What Year? 0 points   What month? 0 points   What time? 0 points   Count back from 20 0 points   Months in reverse 0 points   Repeat phrase 0 points 0 points  Total Score 0 points     Immunizations Immunization History  Administered Date(s) Administered   Influenza Split 02/01/2014   Influenza, High Dose Seasonal PF 02/03/2018, 01/12/2021   Influenza,inj,Quad PF,6+ Mos 01/15/2017   Influenza-Unspecified 01/31/2014, 01/04/2020, 01/29/2022   PFIZER(Purple Top)SARS-COV-2 Vaccination 05/15/2019, 06/06/2019, 01/31/2020, 12/12/2020   PPD Test 03/16/2012, 03/09/2013, 03/14/2014   Pneumococcal Conjugate-13 11/17/2018   Pneumococcal Polysaccharide-23 02/18/2020   Respiratory Syncytial Virus Vaccine,Recomb Aduvanted(Arexvy) 02/18/2022   Tdap 01/01/2011, 12/22/2019   Zoster Recombinat (Shingrix) 11/18/2018, 01/27/2019   Screening Tests Health Maintenance  Topic Date Due   PAP SMEAR-Modifier  04/27/2022   COVID-19 Vaccine (5 - 2023-24 season) 06/19/2022 (Originally 12/14/2021)   COLONOSCOPY (Pts 45-39yr Insurance coverage will need to be confirmed)  07/31/2022   Medicare Annual Wellness (AWV)  06/04/2023   MAMMOGRAM  01/22/2024   DTaP/Tdap/Td (3 - Td or Tdap) 12/21/2029   Pneumonia Vaccine 70 Years old  Completed   INFLUENZA VACCINE  Completed   DEXA SCAN  Completed   Hepatitis C Screening  Completed  Zoster Vaccines- Shingrix  Completed   HPV VACCINES  Aged Out    Health Maintenance Health Maintenance Due  Topic Date Due   PAP SMEAR-Modifier  04/27/2022   Lung Cancer Screening: (Low Dose CT Chest recommended if Age 73-80 years, 30 pack-year currently smoking OR have quit w/in 15years.) does not qualify.   Hepatitis C Screening: Completed 2021,  Vision  Screening: Recommended annual ophthalmology exams for early detection of glaucoma and other disorders of the eye.  Dental Screening: Recommended annual dental exams for proper oral hygiene.  Community Resource Referral / Chronic Care Management: CRR required this visit?  No   CCM required this visit?  No      Plan:     I have personally reviewed and noted the following in the patient's chart:   Medical and social history Use of alcohol, tobacco or illicit drugs  Current medications and supplements including opioid prescriptions. Patient is not currently taking opioid prescriptions. Functional ability and status Nutritional status Physical activity Advanced directives List of other physicians Hospitalizations, surgeries, and ER visits in previous 12 months Vitals Screenings to include cognitive, depression, and falls Referrals and appointments  In addition, I have reviewed and discussed with patient certain preventive protocols, quality metrics, and best practice recommendations. A written personalized care plan for preventive services as well as general preventive health recommendations were provided to patient.     Leta Jungling, LPN   [id number]

## 2022-06-03 NOTE — Patient Instructions (Addendum)
Tanya Fox , Thank you for taking time to come for your Medicare Wellness Visit. I appreciate your ongoing commitment to your health goals. Please review the following plan we discussed and let me know if I can assist you in the future.   These are the goals we discussed:  Goals       Patient Stated     Maintain healthy lifestyle (pt-stated)      Stay active Healthy diet       Other     Follow up with Primary Care Provider      As needed        This is a list of the screening recommended for you and due dates:  Health Maintenance  Topic Date Due   Pap Smear  04/27/2022   COVID-19 Vaccine (5 - 2023-24 season) 06/19/2022*   Colon Cancer Screening  07/31/2022   Medicare Annual Wellness Visit  06/04/2023   Mammogram  01/22/2024   DTaP/Tdap/Td vaccine (3 - Td or Tdap) 12/21/2029   Pneumonia Vaccine  Completed   Flu Shot  Completed   DEXA scan (bone density measurement)  Completed   Hepatitis C Screening: USPSTF Recommendation to screen - Ages 74-79 yo.  Completed   Zoster (Shingles) Vaccine  Completed   HPV Vaccine  Aged Out  *Topic was postponed. The date shown is not the original due date.    Advanced directives: End of life planning; Advance aging; Advanced directives discussed.  Copy of current HCPOA/Living Will requested.    Conditions/risks identified: none new.  Next appointment: Follow up in one year for your annual wellness visit    Preventive Care 65 Years and Older, Female Preventive care refers to lifestyle choices and visits with your health care provider that can promote health and wellness. What does preventive care include? A yearly physical exam. This is also called an annual well check. Dental exams once or twice a year. Routine eye exams. Ask your health care provider how often you should have your eyes checked. Personal lifestyle choices, including: Daily care of your teeth and gums. Regular physical activity. Eating a healthy diet. Avoiding  tobacco and drug use. Limiting alcohol use. Practicing safe sex. Taking low-dose aspirin every day. Taking vitamin and mineral supplements as recommended by your health care provider. What happens during an annual well check? The services and screenings done by your health care provider during your annual well check will depend on your age, overall health, lifestyle risk factors, and family history of disease. Counseling  Your health care provider may ask you questions about your: Alcohol use. Tobacco use. Drug use. Emotional well-being. Home and relationship well-being. Sexual activity. Eating habits. History of falls. Memory and ability to understand (cognition). Work and work Statistician. Reproductive health. Screening  You may have the following tests or measurements: Height, weight, and BMI. Blood pressure. Lipid and cholesterol levels. These may be checked every 5 years, or more frequently if you are over 64 years old. Skin check. Lung cancer screening. You may have this screening every year starting at age 98 if you have a 30-pack-year history of smoking and currently smoke or have quit within the past 15 years. Fecal occult blood test (FOBT) of the stool. You may have this test every year starting at age 58. Flexible sigmoidoscopy or colonoscopy. You may have a sigmoidoscopy every 5 years or a colonoscopy every 10 years starting at age 24. Hepatitis C blood test. Hepatitis B blood test. Sexually transmitted disease (STD) testing.  Diabetes screening. This is done by checking your blood sugar (glucose) after you have not eaten for a while (fasting). You may have this done every 1-3 years. Bone density scan. This is done to screen for osteoporosis. You may have this done starting at age 68. Mammogram. This may be done every 1-2 years. Talk to your health care provider about how often you should have regular mammograms. Talk with your health care provider about your test  results, treatment options, and if necessary, the need for more tests. Vaccines  Your health care provider may recommend certain vaccines, such as: Influenza vaccine. This is recommended every year. Tetanus, diphtheria, and acellular pertussis (Tdap, Td) vaccine. You may need a Td booster every 10 years. Zoster vaccine. You may need this after age 67. Pneumococcal 13-valent conjugate (PCV13) vaccine. One dose is recommended after age 71. Pneumococcal polysaccharide (PPSV23) vaccine. One dose is recommended after age 5. Talk to your health care provider about which screenings and vaccines you need and how often you need them. This information is not intended to replace advice given to you by your health care provider. Make sure you discuss any questions you have with your health care provider. Document Released: 04/28/2015 Document Revised: 12/20/2015 Document Reviewed: 01/31/2015 Elsevier Interactive Patient Education  2017 [place] Prevention in the Home Falls can cause injuries. They can happen to people of all ages. There are many things you can do to make your home safe and to help prevent falls. What can I do on the outside of my home? Regularly fix the edges of walkways and driveways and fix any cracks. Remove anything that might make you trip as you walk through a door, such as a raised step or threshold. Trim any bushes or trees on the path to your home. Use bright outdoor lighting. Clear any walking paths of anything that might make someone trip, such as rocks or tools. Regularly check to see if handrails are loose or broken. Make sure that both sides of any steps have handrails. Any raised decks and porches should have guardrails on the edges. Have any leaves, snow, or ice cleared regularly. Use sand or salt on walking paths during winter. Clean up any spills in your garage right away. This includes oil or grease spills. What can I do in the bathroom? Use night  lights. Install grab bars by the toilet and in the tub and shower. Do not use towel bars as grab bars. Use non-skid mats or decals in the tub or shower. If you need to sit down in the shower, use a plastic, non-slip stool. Keep the floor dry. Clean up any water that spills on the floor as soon as it happens. Remove soap buildup in the tub or shower regularly. Attach bath mats securely with double-sided non-slip rug tape. Do not have throw rugs and other things on the floor that can make you trip. What can I do in the bedroom? Use night lights. Make sure that you have a light by your bed that is easy to reach. Do not use any sheets or blankets that are too big for your bed. They should not hang down onto the floor. Have a firm chair that has side arms. You can use this for support while you get dressed. Do not have throw rugs and other things on the floor that can make you trip. What can I do in the kitchen? Clean up any spills right away. Avoid walking on wet floors.  Keep items that you use a lot in easy-to-reach places. If you need to reach something above you, use a strong step stool that has a grab bar. Keep electrical cords out of the way. Do not use floor polish or wax that makes floors slippery. If you must use wax, use non-skid floor wax. Do not have throw rugs and other things on the floor that can make you trip. What can I do with my stairs? Do not leave any items on the stairs. Make sure that there are handrails on both sides of the stairs and use them. Fix handrails that are broken or loose. Make sure that handrails are as long as the stairways. Check any carpeting to make sure that it is firmly attached to the stairs. Fix any carpet that is loose or worn. Avoid having throw rugs at the top or bottom of the stairs. If you do have throw rugs, attach them to the floor with carpet tape. Make sure that you have a light switch at the top of the stairs and the bottom of the stairs. If  you do not have them, ask someone to add them for you. What else can I do to help prevent falls? Wear shoes that: Do not have high heels. Have rubber bottoms. Are comfortable and fit you well. Are closed at the toe. Do not wear sandals. If you use a stepladder: Make sure that it is fully opened. Do not climb a closed stepladder. Make sure that both sides of the stepladder are locked into place. Ask someone to hold it for you, if possible. Clearly mark and make sure that you can see: Any grab bars or handrails. First and last steps. Where the edge of each step is. Use tools that help you move around (mobility aids) if they are needed. These include: Canes. Walkers. Scooters. Crutches. Turn on the lights when you go into a dark area. Replace any light bulbs as soon as they burn out. Set up your furniture so you have a clear path. Avoid moving your furniture around. If any of your floors are uneven, fix them. If there are any pets around you, be aware of where they are. Review your medicines with your doctor. Some medicines can make you feel dizzy. This can increase your chance of falling. Ask your doctor what other things that you can do to help prevent falls. This information is not intended to replace advice given to you by your health care provider. Make sure you discuss any questions you have with your health care provider. Document Released: 01/26/2009 Document Revised: 09/07/2015 Document Reviewed: 05/06/2014 Elsevier Interactive Patient Education  2017 [employer].

## 2022-06-04 DIAGNOSIS — M25651 Stiffness of right hip, not elsewhere classified: Secondary | ICD-10-CM | POA: Diagnosis not present

## 2022-06-06 ENCOUNTER — Inpatient Hospital Stay: Payer: Medicare HMO | Attending: Oncology

## 2022-06-06 ENCOUNTER — Inpatient Hospital Stay: Payer: Medicare HMO | Admitting: Oncology

## 2022-06-06 ENCOUNTER — Encounter: Payer: Self-pay | Admitting: Oncology

## 2022-06-06 VITALS — BP 118/74 | HR 70 | Temp 97.3°F | Resp 16 | Wt 113.4 lb

## 2022-06-06 DIAGNOSIS — R779 Abnormality of plasma protein, unspecified: Secondary | ICD-10-CM | POA: Insufficient documentation

## 2022-06-06 DIAGNOSIS — D72819 Decreased white blood cell count, unspecified: Secondary | ICD-10-CM | POA: Diagnosis not present

## 2022-06-06 DIAGNOSIS — M810 Age-related osteoporosis without current pathological fracture: Secondary | ICD-10-CM | POA: Diagnosis not present

## 2022-06-06 DIAGNOSIS — Z853 Personal history of malignant neoplasm of breast: Secondary | ICD-10-CM | POA: Insufficient documentation

## 2022-06-06 LAB — CBC WITH DIFFERENTIAL (CANCER CENTER ONLY)
Abs Immature Granulocytes: 0.01 10*3/uL (ref 0.00–0.07)
Basophils Absolute: 0.1 10*3/uL (ref 0.0–0.1)
Basophils Relative: 2 %
Eosinophils Absolute: 0.4 10*3/uL (ref 0.0–0.5)
Eosinophils Relative: 9 %
HCT: 37.4 % (ref 36.0–46.0)
Hemoglobin: 12.6 g/dL (ref 12.0–15.0)
Immature Granulocytes: 0 %
Lymphocytes Relative: 32 %
Lymphs Abs: 1.4 10*3/uL (ref 0.7–4.0)
MCH: 31.7 pg (ref 26.0–34.0)
MCHC: 33.7 g/dL (ref 30.0–36.0)
MCV: 94 fL (ref 80.0–100.0)
Monocytes Absolute: 0.4 10*3/uL (ref 0.1–1.0)
Monocytes Relative: 10 %
Neutro Abs: 2.1 10*3/uL (ref 1.7–7.7)
Neutrophils Relative %: 47 %
Platelet Count: 250 10*3/uL (ref 150–400)
RBC: 3.98 MIL/uL (ref 3.87–5.11)
RDW: 13.3 % (ref 11.5–15.5)
WBC Count: 4.3 10*3/uL (ref 4.0–10.5)
nRBC: 0 % (ref 0.0–0.2)

## 2022-06-06 LAB — CMP (CANCER CENTER ONLY)
ALT: 14 U/L (ref 0–44)
AST: 21 U/L (ref 15–41)
Albumin: 4.2 g/dL (ref 3.5–5.0)
Alkaline Phosphatase: 72 U/L (ref 38–126)
Anion gap: 7 (ref 5–15)
BUN: 15 mg/dL (ref 8–23)
CO2: 29 mmol/L (ref 22–32)
Calcium: 9.8 mg/dL (ref 8.9–10.3)
Chloride: 105 mmol/L (ref 98–111)
Creatinine: 0.63 mg/dL (ref 0.44–1.00)
GFR, Estimated: >60 mL/min (ref >60)
Glucose, Bld: 77 mg/dL (ref 70–99)
Potassium: 4 mmol/L (ref 3.5–5.1)
Sodium: 141 mmol/L (ref 135–145)
Total Bilirubin: 0.4 mg/dL (ref 0.3–1.2)
Total Protein: 6.7 g/dL (ref 6.5–8.1)

## 2022-06-06 NOTE — Progress Notes (Signed)
  [facility] OFFICE PROGRESS NOTE   Diagnosis: Serum monoclonal protein  INTERVAL HISTORY:   Tanya Fox returns as scheduled.  She feels well.  No recent infection.  No pain.  She underwent right hip replacement surgery last month.  Right hip pain has resolved.  Objective:  Vital signs in last 24 hours:  Blood pressure 118/74, pulse 70, temperature (!) 97.3 F (36.3 C), temperature source Tympanic, resp. rate 16, weight 113 lb 6.4 oz (51.4 kg), SpO2 97 %.    Lymphatics: No cervical, supraclavicular, axillary, or inguinal nodes Resp: Lungs clear bilaterally Cardio: Regular rate and rhythm GI: No hepatosplenomegaly Vascular: No leg edema  Skin: Incision at the right thigh   Lab Results:  Lab Results  Component Value Date   WBC 4.3 06/06/2022   HGB 12.6 06/06/2022   HCT 37.4 06/06/2022   MCV 94.0 06/06/2022   PLT 250 06/06/2022   NEUTROABS 2.1 06/06/2022    CMP  Lab Results  Component Value Date   NA 141 06/06/2022   K 4.0 06/06/2022   CL 105 06/06/2022   CO2 29 06/06/2022   GLUCOSE 77 06/06/2022   BUN 15 06/06/2022   CREATININE 0.63 06/06/2022   CALCIUM 9.8 06/06/2022   PROT 6.7 06/06/2022   ALBUMIN 4.2 06/06/2022   AST 21 06/06/2022   ALT 14 06/06/2022   ALKPHOS 72 06/06/2022   BILITOT 0.4 06/06/2022   GFRNONAA >60 06/06/2022     Medications: I have reviewed the patient's current medications.   Assessment/Plan: History of leukopenia/neutropenia  Serum M spike-IgG kappa Low level serum M spike 06/06/2021 Normal immunoglobin levels 06/06/2021  3.   Stage I left-sided breast cancer December 2003, adjuvant [facility] followed by radiation and 5 years of anastrozole  4.   Osteoporosis  5.   Left inguinal hernia repair 06/04/2021     Disposition: Tanya Fox appears stable.  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  We will follow-up on the myeloma panel from today.   She has a history of mild neutropenia, likely benign normal variant.  The neutrophil count is normal today.  [name], MD  06/06/2022  11:08 AM

## 2022-06-07 LAB — KAPPA/LAMBDA LIGHT CHAINS
Kappa free light chain: 15.6 mg/L (ref 3.3–19.4)
Kappa, lambda light chain ratio: 1.54 (ref 0.26–1.65)
Lambda free light chains: 10.1 mg/L (ref 5.7–26.3)

## 2022-06-10 LAB — PROTEIN ELECTROPHORESIS, SERUM
A/G Ratio: 1.3 (ref 0.7–1.7)
Albumin ELP: 3.5 g/dL (ref 2.9–4.4)
Alpha-1-Globulin: 0.3 g/dL (ref 0.0–0.4)
Alpha-2-Globulin: 0.6 g/dL (ref 0.4–1.0)
Beta Globulin: 0.8 g/dL (ref 0.7–1.3)
Gamma Globulin: 0.9 g/dL (ref 0.4–1.8)
Globulin, Total: 2.6 g/dL (ref 2.2–3.9)
M-Spike, %: 0.4 g/dL — ABNORMAL HIGH
Total Protein ELP: 6.1 g/dL (ref 6.0–8.5)

## 2022-07-16 DIAGNOSIS — M25651 Stiffness of right hip, not elsewhere classified: Secondary | ICD-10-CM | POA: Diagnosis not present

## 2022-07-23 ENCOUNTER — Ambulatory Visit: Payer: Self-pay

## 2022-07-23 DIAGNOSIS — Z961 Presence of intraocular lens: Secondary | ICD-10-CM | POA: Diagnosis not present

## 2022-07-23 DIAGNOSIS — H35371 Puckering of macula, right eye: Secondary | ICD-10-CM | POA: Diagnosis not present

## 2022-08-22 ENCOUNTER — Telehealth: Payer: Self-pay

## 2022-08-22 NOTE — Telephone Encounter (Signed)
Patient Advocate Encounter  Prior Authorization for hydrOXYzine HCl 10MG  tablets has been approved through [employer].  Key: [id number]    Effective: 08-16-2022 to 04-15-2023

## 2022-08-22 NOTE — Telephone Encounter (Signed)
Patient aware.

## 2023-02-21 ENCOUNTER — Other Ambulatory Visit: Payer: Medicare HMO

## 2023-02-24 ENCOUNTER — Encounter: Payer: Medicare HMO | Admitting: Internal Medicine

## 2023-03-06 ENCOUNTER — Other Ambulatory Visit: Payer: Medicare HMO

## 2023-03-06 ENCOUNTER — Ambulatory Visit: Payer: Medicare HMO | Admitting: Oncology

## 2023-03-17 ENCOUNTER — Other Ambulatory Visit: Payer: Self-pay | Admitting: Internal Medicine

## 2023-03-18 NOTE — Telephone Encounter (Signed)
Spoke with pt and she stated that she is no longer under Dr. Roby Lofts care because she has moved to [place]. Medication has been refused because pt stated she has already established with a new provider.
